# Patient Record
Sex: Female | Born: 1935 | Race: White | Hispanic: No | Smoking: Former smoker
Health system: Southern US, Community
[De-identification: ages and names within clinical notes are randomized; demographics above are authoritative.]

## PROBLEM LIST (undated history)

## (undated) DIAGNOSIS — E785 Hyperlipidemia, unspecified: Secondary | ICD-10-CM

## (undated) DIAGNOSIS — E039 Hypothyroidism, unspecified: Secondary | ICD-10-CM

## (undated) DIAGNOSIS — I10 Essential (primary) hypertension: Secondary | ICD-10-CM

## (undated) DIAGNOSIS — E079 Disorder of thyroid, unspecified: Secondary | ICD-10-CM

## (undated) DIAGNOSIS — I4891 Unspecified atrial fibrillation: Secondary | ICD-10-CM

## (undated) HISTORY — DX: Unspecified atrial fibrillation: I48.91

## (undated) HISTORY — DX: Hypothyroidism, unspecified: E03.9

## (undated) HISTORY — PX: ABDOMINAL HYSTERECTOMY: SHX81

## (undated) HISTORY — DX: Hyperlipidemia, unspecified: E78.5

---

## 2000-04-14 ENCOUNTER — Encounter: Payer: Self-pay | Admitting: Internal Medicine

## 2000-04-14 ENCOUNTER — Encounter: Admission: RE | Admit: 2000-04-14 | Discharge: 2000-04-14 | Payer: Self-pay | Admitting: Internal Medicine

## 2001-01-06 ENCOUNTER — Ambulatory Visit (HOSPITAL_COMMUNITY): Admission: RE | Admit: 2001-01-06 | Discharge: 2001-01-06 | Payer: Self-pay | Admitting: Gastroenterology

## 2001-02-20 ENCOUNTER — Encounter: Admission: RE | Admit: 2001-02-20 | Discharge: 2001-02-20 | Payer: Self-pay | Admitting: Gastroenterology

## 2001-02-20 ENCOUNTER — Encounter: Payer: Self-pay | Admitting: Gastroenterology

## 2001-04-16 ENCOUNTER — Encounter: Admission: RE | Admit: 2001-04-16 | Discharge: 2001-04-16 | Payer: Self-pay | Admitting: Internal Medicine

## 2001-04-16 ENCOUNTER — Encounter: Payer: Self-pay | Admitting: Internal Medicine

## 2001-08-31 ENCOUNTER — Other Ambulatory Visit: Admission: RE | Admit: 2001-08-31 | Discharge: 2001-08-31 | Payer: Self-pay | Admitting: Internal Medicine

## 2003-01-03 ENCOUNTER — Encounter: Payer: Self-pay | Admitting: Internal Medicine

## 2003-01-03 ENCOUNTER — Encounter: Admission: RE | Admit: 2003-01-03 | Discharge: 2003-01-03 | Payer: Self-pay | Admitting: Internal Medicine

## 2003-03-10 ENCOUNTER — Encounter: Admission: RE | Admit: 2003-03-10 | Discharge: 2003-03-10 | Payer: Self-pay | Admitting: Internal Medicine

## 2003-03-10 ENCOUNTER — Encounter: Payer: Self-pay | Admitting: Internal Medicine

## 2003-05-20 ENCOUNTER — Ambulatory Visit (HOSPITAL_BASED_OUTPATIENT_CLINIC_OR_DEPARTMENT_OTHER): Admission: RE | Admit: 2003-05-20 | Discharge: 2003-05-21 | Payer: Self-pay | Admitting: Orthopedic Surgery

## 2003-10-20 ENCOUNTER — Encounter: Admission: RE | Admit: 2003-10-20 | Discharge: 2003-10-20 | Payer: Self-pay | Admitting: Internal Medicine

## 2004-10-31 ENCOUNTER — Encounter: Admission: RE | Admit: 2004-10-31 | Discharge: 2004-10-31 | Payer: Self-pay | Admitting: Internal Medicine

## 2005-11-27 ENCOUNTER — Encounter: Admission: RE | Admit: 2005-11-27 | Discharge: 2005-11-27 | Payer: Self-pay | Admitting: Internal Medicine

## 2006-12-01 ENCOUNTER — Encounter: Admission: RE | Admit: 2006-12-01 | Discharge: 2006-12-01 | Payer: Self-pay | Admitting: Internal Medicine

## 2007-12-02 ENCOUNTER — Encounter: Admission: RE | Admit: 2007-12-02 | Discharge: 2007-12-02 | Payer: Self-pay | Admitting: Internal Medicine

## 2009-01-02 ENCOUNTER — Encounter: Admission: RE | Admit: 2009-01-02 | Discharge: 2009-01-02 | Payer: Self-pay | Admitting: Internal Medicine

## 2011-01-18 NOTE — Op Note (Signed)
   NAME:  Maria [name], Maria [name]                          ACCOUNT NO.:  [id number]   MEDICAL RECORD NO.:  [id number]                   PATIENT TYPE:  AMB   LOCATION:  [facility]                                  FACILITY:  [facility]   PHYSICIAN:  Thera Flake., M.D.             DATE OF BIRTH:  [birth year]   DATE OF PROCEDURE:  05/20/2003  DATE OF DISCHARGE:  05/21/2003                                 OPERATIVE REPORT   INDICATIONS FOR PROCEDURE:  A 77 year old with an MRI proven rotator cuff  tear, thought to be amenable to overnight hospitalization  and surgery.   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   OPERATION:  1. Arthroscopy.  2. Open rotator cuff repair and acromioplasty.  3. Open distal clavicle excision.   SURGEON:  Dyke Brackett, M.D.   DESCRIPTION OF PROCEDURE:  The patient was arthroscoped. Arthroscopic  evaluation showed moderate fraying of  the labrum which was debrided.  Additionally there was a full thickness cuff tear. Debridement of the joint  was carried out separate from the cuff.   It was next converted to an open procedure with an excision bisecting the  acromioclavicular joint and acromion. Dissection of the distal clavicle from  about 1 cm of the excision followed  by an acromioplasty revealing a  moderate sized cuff tear that required 3 Arthrex bio corkscrew suture  anchors, each with two #2 fiberwires. This created essentially a water tight  repair.   Closure was effected with #1 Tycron and 2-0 Vicryl and Monocryl. The patient  was taken to the recovery room in stable condition.                                                Thera Flake., M.D.    WDC/MEDQ  D:  06/21/2003  T:  06/21/2003  Job:  [id number]

## 2011-02-01 ENCOUNTER — Other Ambulatory Visit: Payer: Self-pay | Admitting: Internal Medicine

## 2011-02-08 ENCOUNTER — Ambulatory Visit
Admission: RE | Admit: 2011-02-08 | Discharge: 2011-02-08 | Disposition: A | Discharge status: Home / Self Care | Payer: Medicare Other | Source: Ambulatory Visit | Attending: Internal Medicine | Admitting: Internal Medicine

## 2016-04-11 ENCOUNTER — Emergency Department (HOSPITAL_COMMUNITY): Payer: Medicare Other

## 2016-04-11 ENCOUNTER — Encounter (HOSPITAL_COMMUNITY): Payer: Self-pay | Admitting: Neurology

## 2016-04-11 ENCOUNTER — Emergency Department (HOSPITAL_COMMUNITY)
Admission: EM | Admit: 2016-04-11 | Discharge: 2016-04-11 | Disposition: A | Discharge status: Home / Self Care | Payer: Medicare Other | Attending: Emergency Medicine | Admitting: Emergency Medicine

## 2016-04-11 DIAGNOSIS — M25561 Pain in right knee: Secondary | ICD-10-CM

## 2016-04-11 DIAGNOSIS — S83412A Sprain of medial collateral ligament of left knee, initial encounter: Secondary | ICD-10-CM | POA: Insufficient documentation

## 2016-04-11 DIAGNOSIS — I10 Essential (primary) hypertension: Secondary | ICD-10-CM | POA: Insufficient documentation

## 2016-04-11 DIAGNOSIS — Y929 Unspecified place or not applicable: Secondary | ICD-10-CM | POA: Insufficient documentation

## 2016-04-11 DIAGNOSIS — X509XXA Other and unspecified overexertion or strenuous movements or postures, initial encounter: Secondary | ICD-10-CM | POA: Insufficient documentation

## 2016-04-11 DIAGNOSIS — S83411A Sprain of medial collateral ligament of right knee, initial encounter: Secondary | ICD-10-CM

## 2016-04-11 DIAGNOSIS — Y999 Unspecified external cause status: Secondary | ICD-10-CM | POA: Insufficient documentation

## 2016-04-11 DIAGNOSIS — S8991XA Unspecified injury of right lower leg, initial encounter: Secondary | ICD-10-CM | POA: Diagnosis present

## 2016-04-11 DIAGNOSIS — Y93H1 Activity, digging, shoveling and raking: Secondary | ICD-10-CM | POA: Diagnosis not present

## 2016-04-11 HISTORY — DX: Essential (primary) hypertension: I10

## 2016-04-11 HISTORY — DX: Disorder of thyroid, unspecified: E07.9

## 2016-04-11 MED ORDER — IBUPROFEN 600 MG PO TABS: 600.0000 mg | ORAL_TABLET | Freq: Four times a day (QID) | ORAL | 0 refills | Status: DC

## 2016-04-11 NOTE — ED Provider Notes (Signed)
[facility]PT Provider Note   CSN: [id number] Arrival date & time: 04/11/16  [id number]  First Provider Contact:  First MD Initiated Contact with Patient 04/11/16 [phone]        History   Chief Complaint Chief Complaint  Patient presents with  . Knee Pain    HPI Maria [name] is a 82 y.o. female.  The history is provided by the patient.  Knee Pain   This is a new problem. The current episode started 2 days ago (was pulling yucca out of the ground, afterward noticed pain). The problem occurs constantly. The problem has not changed since onset.The pain is present in the right knee. The quality of the pain is described as aching and sharp. The pain is moderate. Pertinent negatives include no stiffness. She has tried heat ("knee supports" and lidocaine patch) for the symptoms. The treatment provided no (improved slightly with ice) relief. There has been no history of extremity trauma.    Past Medical History:  Diagnosis Date  . Hypertension   . Thyroid disease     There are no active problems to display for this patient.   Past Surgical History:  Procedure Laterality Date  . ABDOMINAL HYSTERECTOMY      OB History    No data available       Home Medications    Prior to Admission medications   Not on File    Family History No family history on file.  Social History Social History  Substance Use Topics  . Smoking status: Not on file  . Smokeless tobacco: Not on file  . Alcohol use Not on file     Allergies   Review of patient's allergies indicates no known allergies.   Review of Systems Review of Systems  Musculoskeletal: Negative for stiffness.  All other systems reviewed and are negative.    Physical Exam Updated Vital Signs BP 143/68 (BP Location: Right Arm)   Pulse 64   Temp 98.3 F (36.8 C) (Oral)   Resp 16   SpO2 98%   Physical Exam  Constitutional: She is oriented to person, place, and time. She appears well-developed and  well-nourished. No distress.  HENT:  Head: Normocephalic.  Eyes: Conjunctivae are normal.  Neck: Neck supple. No tracheal deviation present.  Cardiovascular: Normal rate and regular rhythm.   Pulmonary/Chest: Effort normal. No respiratory distress.  Abdominal: Soft. She exhibits no distension.  Musculoskeletal:       Right knee: She exhibits normal range of motion, no swelling, no effusion, no deformity, no erythema, normal alignment, no LCL laxity, normal patellar mobility, no bony tenderness, normal meniscus and no MCL laxity. Tenderness found. MCL tenderness noted.  Neurological: She is alert and oriented to person, place, and time.  Skin: Skin is warm and dry.  Psychiatric: She has a normal mood and affect.     ED Treatments / Results  Labs (all labs ordered are listed, but only abnormal results are displayed) Labs Reviewed - No data to display  EKG  EKG Interpretation None       Radiology Dg Knee Complete 4 Views Right  Result Date: 04/11/2016 CLINICAL DATA:  Pain and swelling secondary to a twisting injury 2 days ago. EXAM: RIGHT KNEE - COMPLETE 4+ VIEW COMPARISON:  None. FINDINGS: No evidence of fracture, dislocation, or joint effusion. Medial joint space narrowing with small marginal osteophytes in the medial and patellofemoral compartments. Diffuse osteopenia. IMPRESSION: No acute abnormality.  Slight arthritic changes. Electronically Signed   By:  Francene Boyers M.D.   On: 04/11/2016 08:34    Procedures Procedures (including critical care time)  Medications Ordered in ED Medications - No data to display   Initial Impression / Assessment and Plan / ED Course  I have reviewed the triage vital signs and the nursing notes.  Pertinent labs & imaging results that were available during my care of the patient were reviewed by me and considered in my medical decision making (see chart for details).  Clinical Course    82 year old female presents with right knee pain  after repetitive motion 2 days ago shoveling while trying to harvest yucca. She states that she always uses the same position to shovel and pushes off of her right knee. She thought she initially had strain a muscle but it continued to be painful over the course of the last 2 days and caused her great discomfort overnight she comes for evaluation. Plain film is negative for bony injury and clinical appearance is that of a ligamentous sprain due to repetitive motion rather than arthritis but this was considered. Patient is otherwise healthy without chronic kidney disease. Pt given instructions for supportive care including NSAIDs, rest, ice, compression, and elevation to help alleviate symptoms. Plan to follow up with PCP as needed and return precautions discussed for worsening or new concerning symptoms.   Final Clinical Impressions(s) / ED Diagnoses   Final diagnoses:  Knee pain, right  Knee MCL sprain, right, initial encounter    New Prescriptions New Prescriptions   IBUPROFEN (ADVIL,MOTRIN) 600 MG TABLET    Take 1 tablet (600 mg total) by mouth every 6 (six) hours.     Lyndal Pulley, MD 04/11/16 [phone]

## 2016-04-11 NOTE — ED Triage Notes (Signed)
Pt here c/o right knee pain x 3 days. Denies injury or fall. Can walk and bear wt.

## 2016-04-11 NOTE — ED Notes (Signed)
Patient transported to X-ray 

## 2019-10-31 ENCOUNTER — Ambulatory Visit: Payer: Medicare Other | Attending: Internal Medicine

## 2019-10-31 DIAGNOSIS — Z23 Encounter for immunization: Secondary | ICD-10-CM | POA: Insufficient documentation

## 2019-10-31 NOTE — Progress Notes (Signed)
   Covid-19 Vaccination Clinic  Name:  QUINNLYN HEARNS    MRN: [id number] DOB: 12/08/1933  10/31/2019  Ms. Baillargeon was observed post Covid-19 immunization for 15 minutes without incidence. She was provided with Vaccine Information Sheet and instruction to access the V-Safe system.   Ms. Chapa was instructed to call 911 with any severe reactions post vaccine: Marland Kitchen Difficulty breathing  . Swelling of your face and throat  . A fast heartbeat  . A bad rash all over your body  . Dizziness and weakness    Immunizations Administered    Name Date Dose VIS Date Route   Pfizer COVID-19 Vaccine 10/31/2019  1:39 PM 0.3 mL 08/13/2019 Intramuscular   Manufacturer: [employer], [employer]   Lot: [id number]   NDC: [address]-2

## 2019-11-23 ENCOUNTER — Ambulatory Visit: Payer: Medicare Other | Attending: Internal Medicine

## 2019-11-23 DIAGNOSIS — Z23 Encounter for immunization: Secondary | ICD-10-CM

## 2019-11-23 NOTE — Progress Notes (Signed)
   Covid-19 Vaccination Clinic  Name:  Maria [name]    MRN: [id number] DOB: [birth year]  11/23/2019  Maria [name] was observed post Covid-19 immunization for 15 minutes without incident. She was provided with Vaccine Information Sheet and instruction to access the V-Safe system.   Maria [name] was instructed to call 911 with any severe reactions post vaccine: Marland Kitchen Difficulty breathing  . Swelling of face and throat  . A fast heartbeat  . A bad rash all over body  . Dizziness and weakness   Immunizations Administered    Name Date Dose VIS Date Route   Pfizer COVID-19 Vaccine 11/23/2019  8:55 AM 0.3 mL 08/13/2019 Intramuscular   Manufacturer: [employer], [employer]   Lot: [id number]   NDC: [address]-2

## 2019-11-30 ENCOUNTER — Ambulatory Visit: Payer: Medicare Other

## 2020-02-16 ENCOUNTER — Other Ambulatory Visit: Payer: Self-pay | Admitting: Internal Medicine

## 2020-02-16 DIAGNOSIS — M85859 Other specified disorders of bone density and structure, unspecified thigh: Secondary | ICD-10-CM

## 2020-04-18 DIAGNOSIS — I10 Essential (primary) hypertension: Secondary | ICD-10-CM | POA: Diagnosis not present

## 2020-04-18 DIAGNOSIS — E039 Hypothyroidism, unspecified: Secondary | ICD-10-CM | POA: Diagnosis not present

## 2020-04-18 DIAGNOSIS — E785 Hyperlipidemia, unspecified: Secondary | ICD-10-CM | POA: Diagnosis not present

## 2020-05-05 ENCOUNTER — Other Ambulatory Visit: Payer: Medicare Other

## 2020-05-22 DIAGNOSIS — E039 Hypothyroidism, unspecified: Secondary | ICD-10-CM | POA: Diagnosis not present

## 2020-05-22 DIAGNOSIS — E785 Hyperlipidemia, unspecified: Secondary | ICD-10-CM | POA: Diagnosis not present

## 2020-05-22 DIAGNOSIS — I10 Essential (primary) hypertension: Secondary | ICD-10-CM | POA: Diagnosis not present

## 2020-08-29 ENCOUNTER — Other Ambulatory Visit: Payer: Self-pay | Admitting: Internal Medicine

## 2020-08-29 DIAGNOSIS — M85859 Other specified disorders of bone density and structure, unspecified thigh: Secondary | ICD-10-CM

## 2020-12-06 DIAGNOSIS — K219 Gastro-esophageal reflux disease without esophagitis: Secondary | ICD-10-CM | POA: Diagnosis not present

## 2020-12-06 DIAGNOSIS — E039 Hypothyroidism, unspecified: Secondary | ICD-10-CM | POA: Diagnosis not present

## 2020-12-06 DIAGNOSIS — E785 Hyperlipidemia, unspecified: Secondary | ICD-10-CM | POA: Diagnosis not present

## 2020-12-06 DIAGNOSIS — I10 Essential (primary) hypertension: Secondary | ICD-10-CM | POA: Diagnosis not present

## 2020-12-06 DIAGNOSIS — G8929 Other chronic pain: Secondary | ICD-10-CM | POA: Diagnosis not present

## 2021-01-05 DIAGNOSIS — E785 Hyperlipidemia, unspecified: Secondary | ICD-10-CM | POA: Diagnosis not present

## 2021-01-05 DIAGNOSIS — I10 Essential (primary) hypertension: Secondary | ICD-10-CM | POA: Diagnosis not present

## 2021-01-05 DIAGNOSIS — G8929 Other chronic pain: Secondary | ICD-10-CM | POA: Diagnosis not present

## 2021-01-05 DIAGNOSIS — E039 Hypothyroidism, unspecified: Secondary | ICD-10-CM | POA: Diagnosis not present

## 2021-01-05 DIAGNOSIS — K219 Gastro-esophageal reflux disease without esophagitis: Secondary | ICD-10-CM | POA: Diagnosis not present

## 2021-02-07 DIAGNOSIS — E785 Hyperlipidemia, unspecified: Secondary | ICD-10-CM | POA: Diagnosis not present

## 2021-02-07 DIAGNOSIS — I10 Essential (primary) hypertension: Secondary | ICD-10-CM | POA: Diagnosis not present

## 2021-02-07 DIAGNOSIS — G8929 Other chronic pain: Secondary | ICD-10-CM | POA: Diagnosis not present

## 2021-02-07 DIAGNOSIS — K219 Gastro-esophageal reflux disease without esophagitis: Secondary | ICD-10-CM | POA: Diagnosis not present

## 2021-02-07 DIAGNOSIS — E039 Hypothyroidism, unspecified: Secondary | ICD-10-CM | POA: Diagnosis not present

## 2021-02-16 DIAGNOSIS — E785 Hyperlipidemia, unspecified: Secondary | ICD-10-CM | POA: Diagnosis not present

## 2021-02-16 DIAGNOSIS — E039 Hypothyroidism, unspecified: Secondary | ICD-10-CM | POA: Diagnosis not present

## 2021-02-16 DIAGNOSIS — I10 Essential (primary) hypertension: Secondary | ICD-10-CM | POA: Diagnosis not present

## 2021-03-13 ENCOUNTER — Ambulatory Visit
Admission: RE | Admit: 2021-03-13 | Discharge: 2021-03-13 | Disposition: A | Discharge status: Home / Self Care | Payer: Medicare HMO | Source: Ambulatory Visit | Attending: Internal Medicine | Admitting: Internal Medicine

## 2021-03-13 ENCOUNTER — Other Ambulatory Visit: Payer: Self-pay

## 2021-03-13 DIAGNOSIS — Z780 Asymptomatic menopausal state: Secondary | ICD-10-CM | POA: Diagnosis not present

## 2021-03-13 DIAGNOSIS — M810 Age-related osteoporosis without current pathological fracture: Secondary | ICD-10-CM | POA: Diagnosis not present

## 2021-03-13 DIAGNOSIS — M85831 Other specified disorders of bone density and structure, right forearm: Secondary | ICD-10-CM | POA: Diagnosis not present

## 2021-03-13 DIAGNOSIS — M85859 Other specified disorders of bone density and structure, unspecified thigh: Secondary | ICD-10-CM

## 2021-03-30 DIAGNOSIS — G8929 Other chronic pain: Secondary | ICD-10-CM | POA: Diagnosis not present

## 2021-03-30 DIAGNOSIS — E039 Hypothyroidism, unspecified: Secondary | ICD-10-CM | POA: Diagnosis not present

## 2021-03-30 DIAGNOSIS — I10 Essential (primary) hypertension: Secondary | ICD-10-CM | POA: Diagnosis not present

## 2021-03-30 DIAGNOSIS — K219 Gastro-esophageal reflux disease without esophagitis: Secondary | ICD-10-CM | POA: Diagnosis not present

## 2021-03-30 DIAGNOSIS — E785 Hyperlipidemia, unspecified: Secondary | ICD-10-CM | POA: Diagnosis not present

## 2021-04-05 DIAGNOSIS — E785 Hyperlipidemia, unspecified: Secondary | ICD-10-CM | POA: Diagnosis not present

## 2021-04-05 DIAGNOSIS — K219 Gastro-esophageal reflux disease without esophagitis: Secondary | ICD-10-CM | POA: Diagnosis not present

## 2021-04-05 DIAGNOSIS — E039 Hypothyroidism, unspecified: Secondary | ICD-10-CM | POA: Diagnosis not present

## 2021-04-05 DIAGNOSIS — G8929 Other chronic pain: Secondary | ICD-10-CM | POA: Diagnosis not present

## 2021-04-05 DIAGNOSIS — I10 Essential (primary) hypertension: Secondary | ICD-10-CM | POA: Diagnosis not present

## 2021-06-01 DIAGNOSIS — K219 Gastro-esophageal reflux disease without esophagitis: Secondary | ICD-10-CM | POA: Diagnosis not present

## 2021-06-01 DIAGNOSIS — E785 Hyperlipidemia, unspecified: Secondary | ICD-10-CM | POA: Diagnosis not present

## 2021-06-01 DIAGNOSIS — I10 Essential (primary) hypertension: Secondary | ICD-10-CM | POA: Diagnosis not present

## 2021-06-01 DIAGNOSIS — E039 Hypothyroidism, unspecified: Secondary | ICD-10-CM | POA: Diagnosis not present

## 2021-06-01 DIAGNOSIS — G8929 Other chronic pain: Secondary | ICD-10-CM | POA: Diagnosis not present

## 2021-08-01 DIAGNOSIS — G8929 Other chronic pain: Secondary | ICD-10-CM | POA: Diagnosis not present

## 2021-08-01 DIAGNOSIS — I10 Essential (primary) hypertension: Secondary | ICD-10-CM | POA: Diagnosis not present

## 2021-09-27 DIAGNOSIS — E785 Hyperlipidemia, unspecified: Secondary | ICD-10-CM | POA: Diagnosis not present

## 2021-09-27 DIAGNOSIS — G8929 Other chronic pain: Secondary | ICD-10-CM | POA: Diagnosis not present

## 2021-09-27 DIAGNOSIS — I10 Essential (primary) hypertension: Secondary | ICD-10-CM | POA: Diagnosis not present

## 2021-09-27 DIAGNOSIS — E039 Hypothyroidism, unspecified: Secondary | ICD-10-CM | POA: Diagnosis not present

## 2021-10-18 ENCOUNTER — Inpatient Hospital Stay (HOSPITAL_COMMUNITY)
Admission: EM | Admit: 2021-10-18 | Discharge: 2021-10-22 | DRG: 565 | Disposition: A | Discharge status: Home / Self Care | Payer: Medicare Other | Attending: Family Medicine | Admitting: Family Medicine

## 2021-10-18 ENCOUNTER — Other Ambulatory Visit: Payer: Self-pay

## 2021-10-18 ENCOUNTER — Emergency Department (HOSPITAL_COMMUNITY): Payer: Medicare Other

## 2021-10-18 ENCOUNTER — Encounter (HOSPITAL_COMMUNITY): Payer: Self-pay | Admitting: Emergency Medicine

## 2021-10-18 DIAGNOSIS — R7989 Other specified abnormal findings of blood chemistry: Secondary | ICD-10-CM | POA: Diagnosis not present

## 2021-10-18 DIAGNOSIS — L8990 Pressure ulcer of unspecified site, unspecified stage: Secondary | ICD-10-CM | POA: Diagnosis present

## 2021-10-18 DIAGNOSIS — E039 Hypothyroidism, unspecified: Secondary | ICD-10-CM

## 2021-10-18 DIAGNOSIS — W19XXXA Unspecified fall, initial encounter: Secondary | ICD-10-CM | POA: Diagnosis present

## 2021-10-18 DIAGNOSIS — R000 Tachycardia, unspecified: Secondary | ICD-10-CM | POA: Diagnosis not present

## 2021-10-18 DIAGNOSIS — E860 Dehydration: Secondary | ICD-10-CM

## 2021-10-18 DIAGNOSIS — R350 Frequency of micturition: Secondary | ICD-10-CM | POA: Diagnosis not present

## 2021-10-18 DIAGNOSIS — I10 Essential (primary) hypertension: Secondary | ICD-10-CM

## 2021-10-18 DIAGNOSIS — K8020 Calculus of gallbladder without cholecystitis without obstruction: Secondary | ICD-10-CM | POA: Diagnosis not present

## 2021-10-18 DIAGNOSIS — I4891 Unspecified atrial fibrillation: Secondary | ICD-10-CM

## 2021-10-18 DIAGNOSIS — M6282 Rhabdomyolysis: Secondary | ICD-10-CM

## 2021-10-18 DIAGNOSIS — E872 Acidosis, unspecified: Secondary | ICD-10-CM

## 2021-10-18 DIAGNOSIS — R296 Repeated falls: Secondary | ICD-10-CM

## 2021-10-18 DIAGNOSIS — L89152 Pressure ulcer of sacral region, stage 2: Secondary | ICD-10-CM | POA: Diagnosis not present

## 2021-10-18 DIAGNOSIS — R52 Pain, unspecified: Secondary | ICD-10-CM

## 2021-10-18 DIAGNOSIS — T796XXA Traumatic ischemia of muscle, initial encounter: Secondary | ICD-10-CM | POA: Diagnosis not present

## 2021-10-18 DIAGNOSIS — R778 Other specified abnormalities of plasma proteins: Secondary | ICD-10-CM

## 2021-10-18 DIAGNOSIS — R6510 Systemic inflammatory response syndrome (SIRS) of non-infectious origin without acute organ dysfunction: Secondary | ICD-10-CM

## 2021-10-18 DIAGNOSIS — E876 Hypokalemia: Secondary | ICD-10-CM | POA: Diagnosis not present

## 2021-10-18 DIAGNOSIS — M25511 Pain in right shoulder: Secondary | ICD-10-CM | POA: Diagnosis not present

## 2021-10-18 DIAGNOSIS — Z20822 Contact with and (suspected) exposure to covid-19: Secondary | ICD-10-CM | POA: Diagnosis not present

## 2021-10-18 DIAGNOSIS — Z743 Need for continuous supervision: Secondary | ICD-10-CM | POA: Diagnosis not present

## 2021-10-18 DIAGNOSIS — Z79899 Other long term (current) drug therapy: Secondary | ICD-10-CM

## 2021-10-18 DIAGNOSIS — S4991XA Unspecified injury of right shoulder and upper arm, initial encounter: Secondary | ICD-10-CM | POA: Diagnosis not present

## 2021-10-18 DIAGNOSIS — I480 Paroxysmal atrial fibrillation: Secondary | ICD-10-CM | POA: Insufficient documentation

## 2021-10-18 DIAGNOSIS — F411 Generalized anxiety disorder: Secondary | ICD-10-CM | POA: Insufficient documentation

## 2021-10-18 DIAGNOSIS — F419 Anxiety disorder, unspecified: Secondary | ICD-10-CM | POA: Diagnosis present

## 2021-10-18 DIAGNOSIS — I499 Cardiac arrhythmia, unspecified: Secondary | ICD-10-CM | POA: Diagnosis not present

## 2021-10-18 DIAGNOSIS — M19011 Primary osteoarthritis, right shoulder: Secondary | ICD-10-CM | POA: Diagnosis not present

## 2021-10-18 LAB — URINALYSIS, ROUTINE W REFLEX MICROSCOPIC
Bacteria, UA: NONE SEEN
Bilirubin Urine: NEGATIVE
Glucose, UA: NEGATIVE mg/dL
Ketones, ur: 80 mg/dL — AB
Leukocytes,Ua: NEGATIVE
Nitrite: NEGATIVE
Protein, ur: >=300 mg/dL — AB
Specific Gravity, Urine: 1.02 (ref 1.005–1.030)
pH: 6 (ref 5.0–8.0)

## 2021-10-18 LAB — TROPONIN I (HIGH SENSITIVITY)
Troponin I (High Sensitivity): 49 ng/L — ABNORMAL HIGH (ref <18)
Troponin I (High Sensitivity): 52 ng/L — ABNORMAL HIGH (ref <18)

## 2021-10-18 LAB — CBC WITH DIFFERENTIAL/PLATELET
Abs Immature Granulocytes: 0.16 10*3/uL — ABNORMAL HIGH (ref 0.00–0.07)
Basophils Absolute: 0 10*3/uL (ref 0.0–0.1)
Basophils Relative: 0 %
Eosinophils Absolute: 0 10*3/uL (ref 0.0–0.5)
Eosinophils Relative: 0 %
HCT: 44.9 % (ref 36.0–46.0)
Hemoglobin: 14.4 g/dL (ref 12.0–15.0)
Immature Granulocytes: 1 %
Lymphocytes Relative: 4 %
Lymphs Abs: 1 10*3/uL (ref 0.7–4.0)
MCH: 28.2 pg (ref 26.0–34.0)
MCHC: 32.1 g/dL (ref 30.0–36.0)
MCV: 87.9 fL (ref 80.0–100.0)
Monocytes Absolute: 2.7 10*3/uL — ABNORMAL HIGH (ref 0.1–1.0)
Monocytes Relative: 11 %
Neutro Abs: 21.6 10*3/uL — ABNORMAL HIGH (ref 1.7–7.7)
Neutrophils Relative %: 84 %
Platelets: 240 10*3/uL (ref 150–400)
RBC: 5.11 MIL/uL (ref 3.87–5.11)
RDW: 16.1 % — ABNORMAL HIGH (ref 11.5–15.5)
WBC: 25.5 10*3/uL — ABNORMAL HIGH (ref 4.0–10.5)
nRBC: 0 % (ref 0.0–0.2)

## 2021-10-18 LAB — RESP PANEL BY RT-PCR (FLU A&B, COVID) ARPGX2
Influenza A by PCR: NEGATIVE
Influenza B by PCR: NEGATIVE
SARS Coronavirus 2 by RT PCR: NEGATIVE

## 2021-10-18 LAB — COMPREHENSIVE METABOLIC PANEL
ALT: 143 U/L — ABNORMAL HIGH (ref 0–44)
AST: 393 U/L — ABNORMAL HIGH (ref 15–41)
Albumin: 4 g/dL (ref 3.5–5.0)
Alkaline Phosphatase: 55 U/L (ref 38–126)
Anion gap: 13 (ref 5–15)
BUN: 29 mg/dL — ABNORMAL HIGH (ref 8–23)
CO2: 23 mmol/L (ref 22–32)
Calcium: 8.8 mg/dL — ABNORMAL LOW (ref 8.9–10.3)
Chloride: 108 mmol/L (ref 98–111)
Creatinine, Ser: 0.51 mg/dL (ref 0.44–1.00)
GFR, Estimated: >60 mL/min (ref >60)
Glucose, Bld: 114 mg/dL — ABNORMAL HIGH (ref 70–99)
Potassium: 4.6 mmol/L (ref 3.5–5.1)
Sodium: 144 mmol/L (ref 135–145)
Total Bilirubin: 2.3 mg/dL — ABNORMAL HIGH (ref 0.3–1.2)
Total Protein: 7.6 g/dL (ref 6.5–8.1)

## 2021-10-18 LAB — LACTIC ACID, PLASMA
Lactic Acid, Venous: 2.2 mmol/L (ref 0.5–1.9)
Lactic Acid, Venous: 1.7 mmol/L (ref 0.5–1.9)

## 2021-10-18 LAB — AMMONIA: Ammonia: 37 umol/L — ABNORMAL HIGH (ref 9–35)

## 2021-10-18 LAB — MAGNESIUM: Magnesium: 2.7 mg/dL — ABNORMAL HIGH (ref 1.7–2.4)

## 2021-10-18 LAB — LIPASE, BLOOD: Lipase: 39 U/L (ref 11–51)

## 2021-10-18 LAB — TSH: TSH: 1.228 u[IU]/mL (ref 0.350–4.500)

## 2021-10-18 LAB — CK: Total CK: 9498 U/L — ABNORMAL HIGH (ref 38–234)

## 2021-10-18 LAB — PROTIME-INR
INR: 1.1 (ref 0.8–1.2)
Prothrombin Time: 14.6 seconds (ref 11.4–15.2)

## 2021-10-18 LAB — APTT: aPTT: 27 seconds (ref 24–36)

## 2021-10-18 IMAGING — CT CT HEAD W/O CM
3 series · 14 of 47 positions shown, 16 images · non-contrast
Comparison: None.

None

CLINICAL DATA: A female at age 87 presents for evaluation of neck
trauma, multiple falls over the past 2 days



[Series 2: head wo · axial · 0.47mm/px · z∈[-159,-24]mm · 8 of 33 slices shown, 10 images]
[im 3/33  brain]
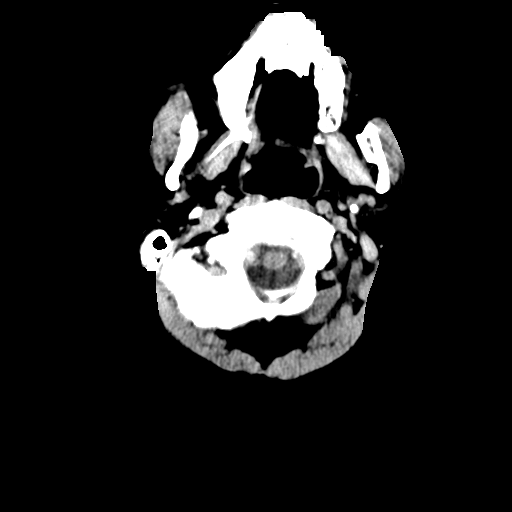
[im 3/33  bone]
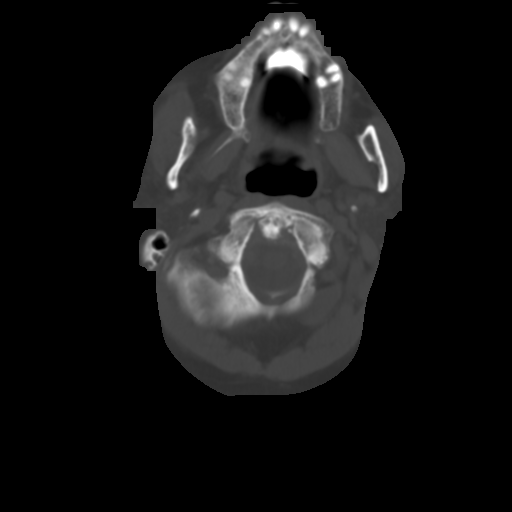
[im 7/33  brain]
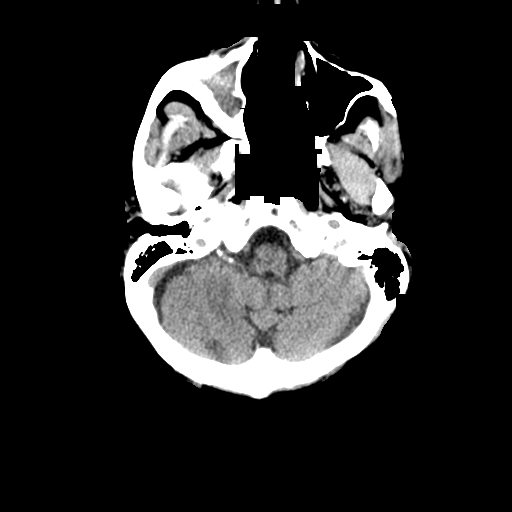
[im 10/33  brain]
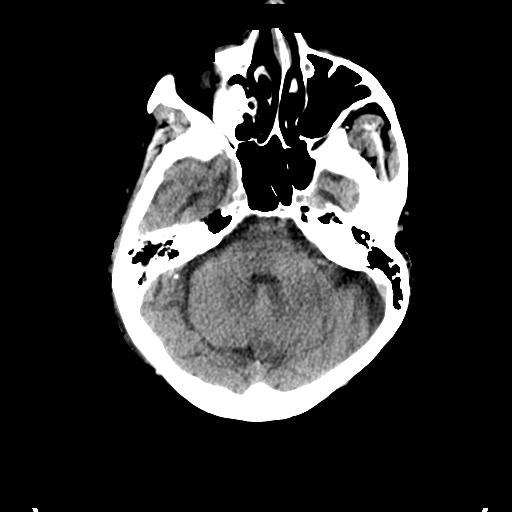
[im 15/33  brain]
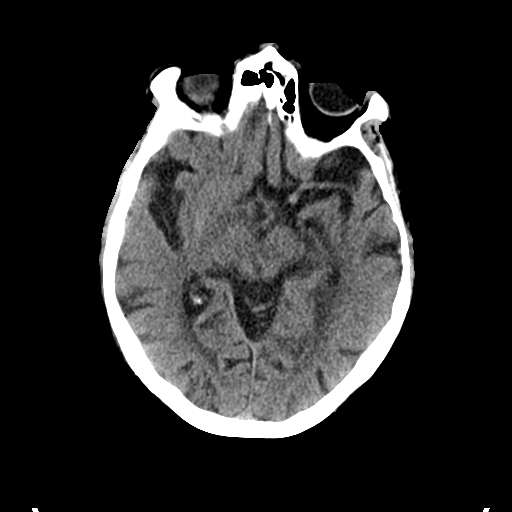
[im 18/33  brain]
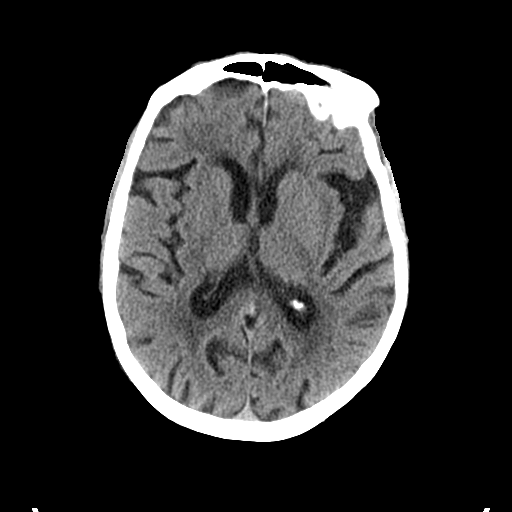
[im 18/33  bone]
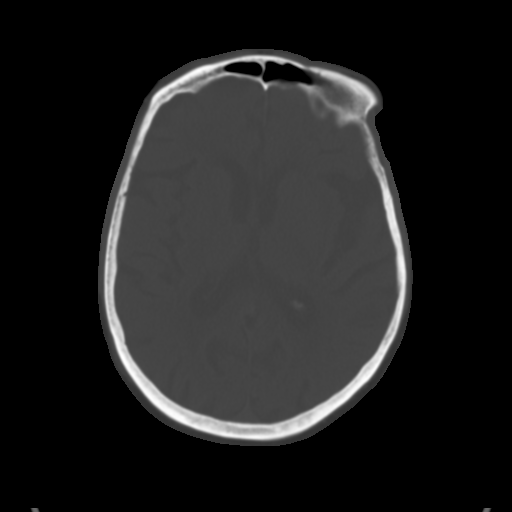
[im 23/33  brain]
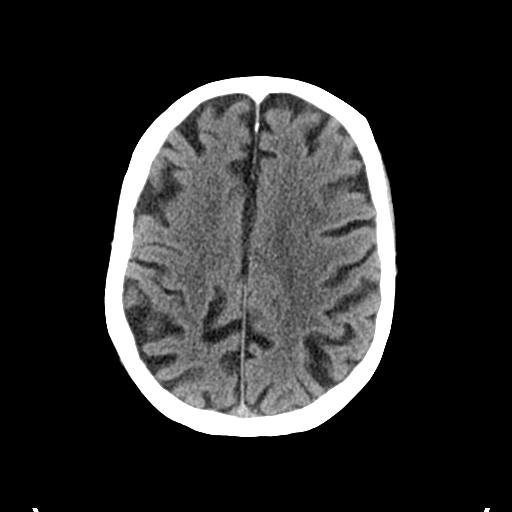
[im 26/33  brain]
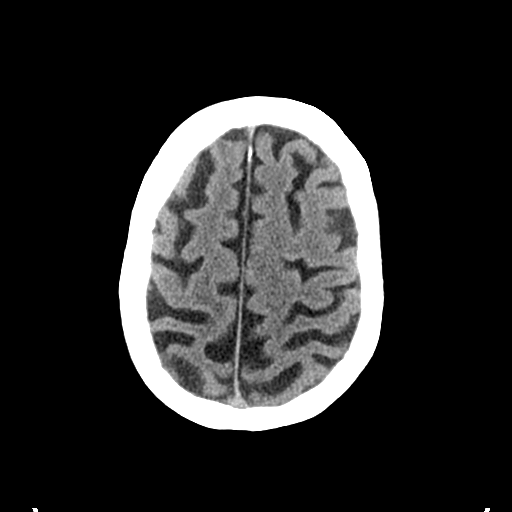
[im 30/33  brain]
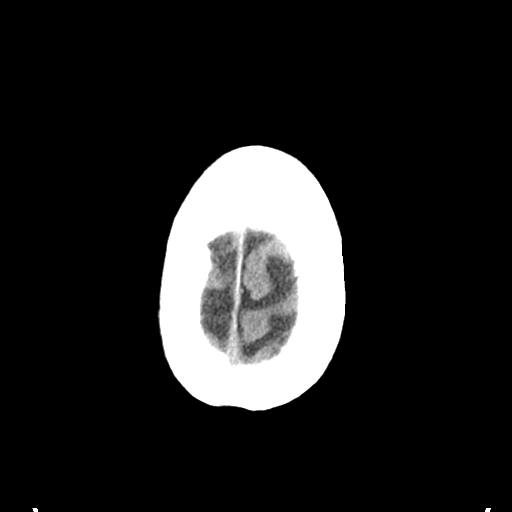

[Series 5: coronal soft tissue · coronal · 0.33mm/px · 3 of 74 slices shown]
[im 25/74  brain]
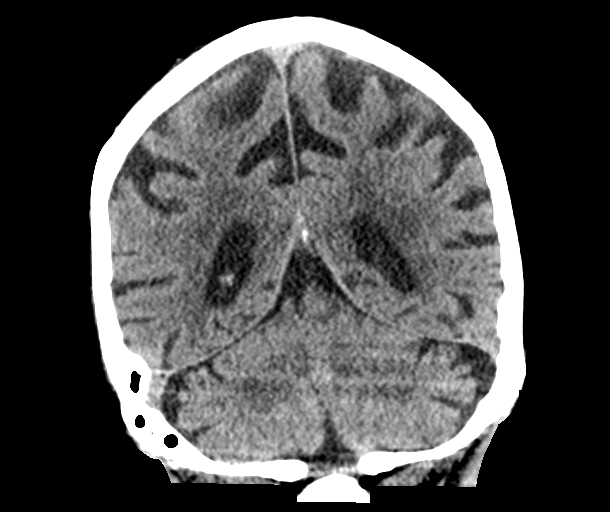
[im 33/74  brain]
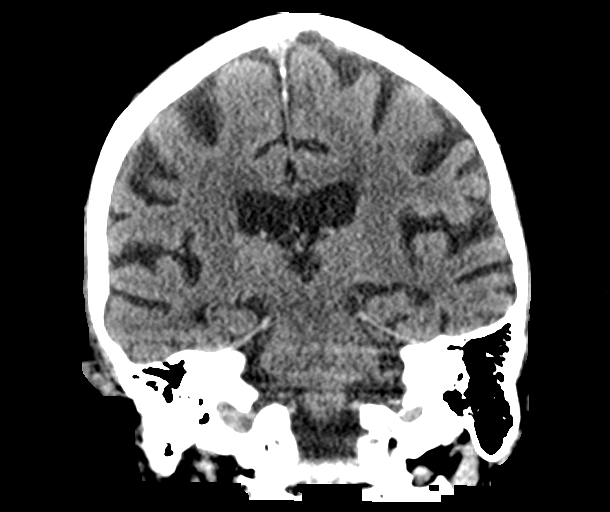
[im 41/74  brain]
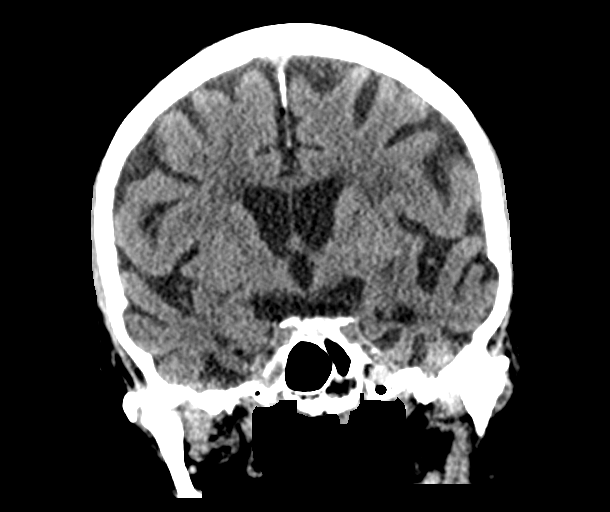

[Series 6: sagittal soft tissue · sagittal · 0.33mm/px · 3 of 59 slices shown]
[im 20/59  brain]
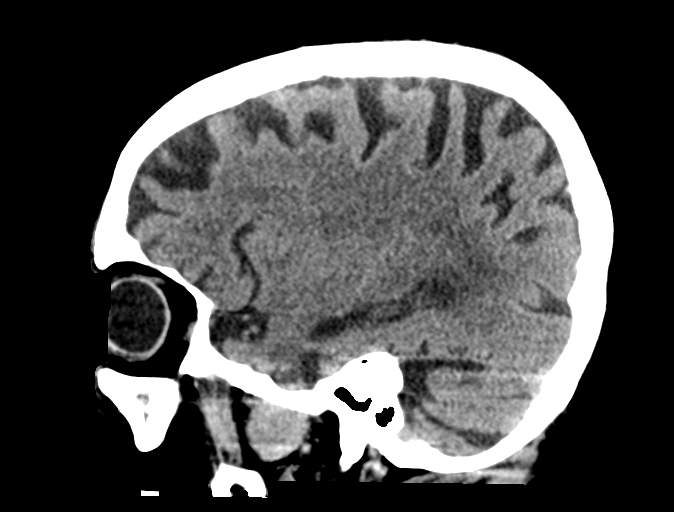
[im 30/59  brain]
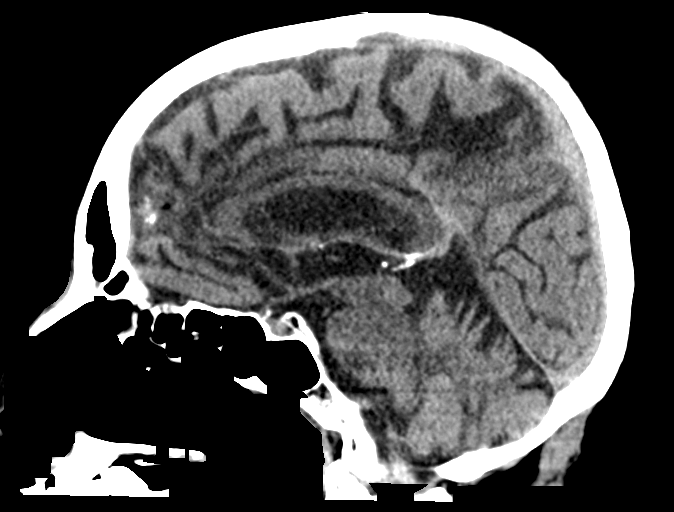
[im 39/59  brain]
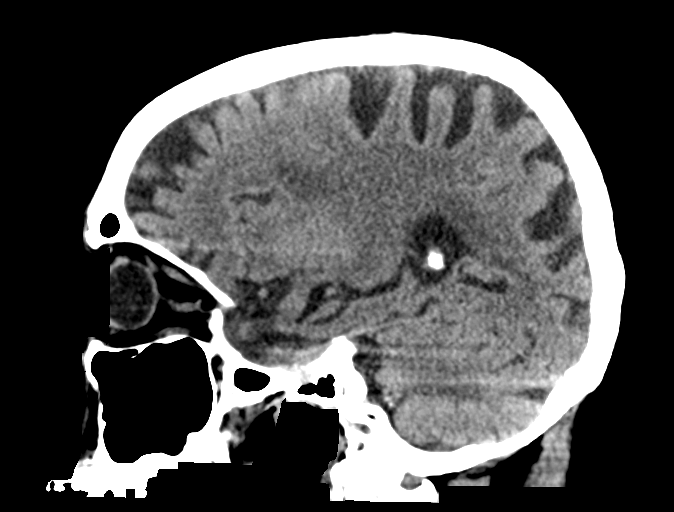

[14 of 47 positions shown; findings below may reference images not displayed]

FINDINGS: CT HEAD FINDINGS

Brain: No evidence of acute infarction, hemorrhage, hydrocephalus,
extra-axial collection or mass lesion/mass effect. Signs of chronic
microvascular ischemic change and generalized atrophy.

Vascular: No hyperdense vessel or unexpected calcification.

Skull: Normal. Negative for fracture or focal lesion.

Sinuses/Orbits: Complete opacification of the RIGHT maxillary sinus
with bony thickening compatible with chronic sinusitis. No air-fluid
levels in the remaining sinuses with minimal scattered ethmoid
opacification.

Other: None

CT CERVICAL SPINE FINDINGS

Alignment: Areas of variable antra retrolisthesis very mild in the
cervical spine associated with marked degenerative changes. 1-2 mm
anterolisthesis of C3 on C4. 3 mm retrolisthesis of C4 on C5. 3 mm
anterolisthesis of C6 on C7. Preservation and mild accentuation of
normal cervical lordotic curvature. 3 mm anterolisthesis of T1 on T2
is noted also with surrounding degenerative changes and
approximately 2 mm anterolisthesis of C2 on T3.

Skull base and vertebrae: No acute fracture. No primary bone lesion
or focal pathologic process.

Soft tissues and spinal canal: No prevertebral fluid or swelling. No
visible canal hematoma.

Disc levels: Multilevel disc space narrowing and facet arthropathy
associated with areas of antro and retrolisthesis as discussed
above. Degenerative changes are marked and worse in the mid and
lower cervical spine.

Upper chest: Pulmonary emphysema, moderate at the lung apices.

Other: None
IMPRESSION: 1. No acute intracranial abnormality.
2. Signs of chronic microvascular ischemic change and generalized
atrophy.
3. No evidence for acute traumatic injury to the cervical spine.
4. Multilevel degenerative changes as discussed with associated
changes and alignment likely related to profound disc space
narrowing and multilevel facet arthropathy. Not associated with
prevertebral soft tissue swelling.
5. Pulmonary emphysema, moderate at the lung apices.
6. Chronic RIGHT maxillary sinusitis complete opacification of the
RIGHT maxillary sinus is noted. Correlate with any symptoms of sinus
disease.

Emphysema ([ST]-[ST]).

## 2021-10-18 MED ORDER — DILTIAZEM HCL-DEXTROSE 125-5 MG/125ML-% IV SOLN (PREMIX): 5.0000 mg/h | INTRAVENOUS | Status: DC

## 2021-10-18 MED ORDER — HEPARIN (PORCINE) 25000 UT/250ML-% IV SOLN
1150.0000 [IU]/h | INTRAVENOUS | Status: AC
  Administered 2021-10-18: 21:00:00 800 [IU]/h via INTRAVENOUS
  Administered 2021-10-19: 22:00:00 1050 [IU]/h via INTRAVENOUS
  Filled 2021-10-18 (×2): qty 250

## 2021-10-18 MED ORDER — HEPARIN BOLUS VIA INFUSION
2000.0000 [IU] | Freq: Once | INTRAVENOUS | Status: AC
  Administered 2021-10-18: 2000 [IU] via INTRAVENOUS
  Filled 2021-10-18: qty 2000

## 2021-10-18 MED ORDER — SODIUM CHLORIDE 0.9 % IV SOLN: INTRAVENOUS | Status: DC

## 2021-10-18 MED ORDER — NYSTATIN 100000 UNIT/GM EX POWD
Freq: Once | CUTANEOUS | Status: AC
Start: 2021-10-18 — End: 2021-10-18
  Filled 2021-10-18: qty 15

## 2021-10-18 MED ORDER — DILTIAZEM HCL-DEXTROSE 125-5 MG/125ML-% IV SOLN (PREMIX)
5.0000 mg/h | INTRAVENOUS | Status: DC
  Administered 2021-10-18 – 2021-10-19 (×3): 5 mg/h via INTRAVENOUS
  Filled 2021-10-18 (×3): qty 125

## 2021-10-18 MED ORDER — SODIUM CHLORIDE 0.9 % IV BOLUS
1000.0000 mL | Freq: Once | INTRAVENOUS | Status: AC
  Administered 2021-10-18: 1000 mL via INTRAVENOUS

## 2021-10-18 NOTE — ED Provider Notes (Signed)
**Maria [name] De-Identified via Obfuscation** Maria Maria [name]   CSN: [id number] Arrival date & time: 10/18/21  1230     History  Chief Complaint  Patient presents with   Maria Maria [name] is a 88 y.o. female.  The history is provided by the patient and medical records. No language interpreter was used.  Fall This is a recurrent problem. The current episode started yesterday. The problem occurs rarely. The problem has not changed since onset.Pertinent negatives include no chest pain, no abdominal pain, no headaches and no shortness of breath. Nothing aggravates the symptoms. Nothing relieves the symptoms. She has tried nothing for the symptoms. The treatment provided no relief.      Home Medications Prior to Admission medications   Medication Sig Start Date End Date Taking? Authorizing Provider  ibuprofen (ADVIL,MOTRIN) 600 MG tablet Take 1 tablet (600 mg total) by mouth every 6 (six) hours. 04/11/16   Leo Grosser, MD      Allergies    Patient has no known allergies.    Review of Systems   Review of Systems  Constitutional:  Positive for fatigue. Negative for chills, diaphoresis and fever.  HENT:  Negative for congestion.   Respiratory:  Negative for cough, chest tightness, shortness of breath, wheezing and stridor.   Cardiovascular:  Negative for chest pain, palpitations and leg swelling.  Gastrointestinal:  Negative for abdominal pain, constipation, diarrhea, nausea and vomiting.  Genitourinary:  Negative for dysuria and flank pain.  Musculoskeletal:  Negative for back pain, neck pain and neck stiffness.  Skin:  Positive for color change. Negative for rash and wound.  Neurological:  Positive for light-headedness. Negative for dizziness, seizures, speech difficulty, weakness, numbness and headaches.  Psychiatric/Behavioral:  Negative for agitation and confusion.   All other systems reviewed and are negative.  Physical Exam Updated Vital Signs There were no vitals  taken for this visit. Physical Exam Vitals and nursing Maria [name] reviewed.  Constitutional:      General: She is not in acute distress.    Appearance: She is well-developed. She is not ill-appearing, toxic-appearing or diaphoretic.  HENT:     Head: Contusion present.     Comments: Redness and bruising on right scalp, right shoulder, right torso.    Nose: No congestion.     Mouth/Throat:     Mouth: Mucous membranes are dry.  Eyes:     Extraocular Movements: Extraocular movements intact.     Conjunctiva/sclera: Conjunctivae normal.     Pupils: Pupils are equal, round, and reactive to light.  Cardiovascular:     Rate and Rhythm: Tachycardia present. Rhythm irregular.     Heart sounds: No murmur heard. Pulmonary:     Effort: Pulmonary effort is normal. No respiratory distress.     Breath sounds: Normal breath sounds. No wheezing, rhonchi or rales.  Chest:     Chest wall: No tenderness.  Abdominal:     General: Abdomen is flat.     Palpations: Abdomen is soft.     Tenderness: There is no abdominal tenderness. There is no right CVA tenderness, left CVA tenderness, guarding or rebound.  Musculoskeletal:        General: Tenderness present. No swelling.     Right shoulder: Swelling and tenderness present.     Cervical back: Neck supple. No tenderness.     Comments: Intact sensation, strength, and pulses in extremities.  Bruising on right shoulder with some tenderness and swelling.  Some irritation erythema to right flank.  Under her pannus there is evidence of likely yeast infection.  No other tenderness.  Skin:    General: Skin is warm and dry.     Capillary Refill: Capillary refill takes less than 2 seconds.     Findings: Bruising and erythema present.  Neurological:     General: No focal deficit present.     Mental Status: She is alert.     Sensory: No sensory deficit.     Motor: No weakness.  Psychiatric:        Mood and Affect: Mood normal.    ED Results / Procedures /  Treatments   Labs (all labs ordered are listed, but only abnormal results are displayed) Labs Reviewed  CBC WITH DIFFERENTIAL/PLATELET - Abnormal; Notable for the following components:      Result Value   WBC 25.5 (*)    RDW 16.1 (*)    Neutro Abs 21.6 (*)    Monocytes Absolute 2.7 (*)    Abs Immature Granulocytes 0.16 (*)    All other components within normal limits  COMPREHENSIVE METABOLIC PANEL - Abnormal; Notable for the following components:   Glucose, Bld 114 (*)    BUN 29 (*)    Calcium 8.8 (*)    AST 393 (*)    ALT 143 (*)    Total Bilirubin 2.3 (*)    All other components within normal limits  LACTIC ACID, PLASMA - Abnormal; Notable for the following components:   Lactic Acid, Venous 2.2 (*)    All other components within normal limits  CK - Abnormal; Notable for the following components:   Total CK 9,498 (*)    All other components within normal limits  URINALYSIS, ROUTINE W REFLEX MICROSCOPIC - Abnormal; Notable for the following components:   Hgb urine dipstick LARGE (*)    Ketones, ur 80 (*)    Protein, ur >=300 (*)    All other components within normal limits  MAGNESIUM - Abnormal; Notable for the following components:   Magnesium 2.7 (*)    All other components within normal limits  TROPONIN I (HIGH SENSITIVITY) - Abnormal; Notable for the following components:   Troponin I (High Sensitivity) 49 (*)    All other components within normal limits  RESP PANEL BY RT-PCR (FLU A&B, COVID) ARPGX2  URINE CULTURE  LIPASE, BLOOD  TSH  LACTIC ACID, PLASMA  HEPATITIS PANEL, ACUTE  AMMONIA  PROTIME-INR  TROPONIN I (HIGH SENSITIVITY)    EKG None  Radiology DG Shoulder Right  Result Date: 10/18/2021 CLINICAL DATA:  Fall, right shoulder pain and bruising EXAM: RIGHT SHOULDER - 2+ VIEW COMPARISON:  Shoulder radiographs obtained earlier the same day FINDINGS: There is marked degenerative change at the glenohumeral joint with a high-riding humeral head suggesting  rotator cuff pathology. There are moderate degenerative changes of the [facility] joint. The previously described lucent lesion is favored to reflect artifact. There is no acute fracture or dislocation. IMPRESSION: 1. The previously described lucent lesion is favored to reflect artifact. No suspicious lesions are seen. 2. Marked degenerative changes about the shoulder as above. No acute fracture or dislocation. Electronically Signed   By: Valetta Mole M.D.   On: 10/18/2021 14:46   DG Shoulder Right  Result Date: 10/18/2021 CLINICAL DATA:  Found down, tachycardia EXAM: RIGHT SHOULDER - 2+ VIEW COMPARISON:  None FINDINGS: Osseous demineralization. Degenerative changes at [facility] joint with significant inferior acromial spur formation. Glenohumeral joint space narrowing and spur formation. Questionable lucent lesion within the proximal RIGHT  humerus at the head/metaphyseal junction versus artifact from surrounding spurs. No acute fracture or dislocation. Remodeling of the undersurface of the acromion likely reflecting chronic rotator cuff tear. No acute rib abnormalities. IMPRESSION: Degenerative changes of the RIGHT acromioclavicular and glenohumeral joints knee without acute fracture or dislocation. Questionable lucent lesion at the head/metaphyseal junction of the proximal RIGHT humerus versus artifact related to surrounding spur formation; consider internal and external rotation views of RIGHT shoulder when patient is clinically able. Electronically Signed   By: Lavonia Dana M.D.   On: 10/18/2021 13:57   CT HEAD WO CONTRAST (5MM)  Result Date: 10/18/2021 CLINICAL DATA:  A female at age 28 presents for evaluation of neck trauma, multiple falls over the past 2 days EXAM: CT HEAD WITHOUT CONTRAST CT CERVICAL SPINE WITHOUT CONTRAST TECHNIQUE: Multidetector CT imaging of the head and cervical spine was performed following the standard protocol without intravenous contrast. Multiplanar CT image reconstructions of the cervical  spine were also generated. RADIATION DOSE REDUCTION: This exam was performed according to the departmental dose-optimization program which includes automated exposure control, adjustment of the mA and/or kV according to patient size and/or use of iterative reconstruction technique. COMPARISON:  None. None FINDINGS: CT HEAD FINDINGS Brain: No evidence of acute infarction, hemorrhage, hydrocephalus, extra-axial collection or mass lesion/mass effect. Signs of chronic microvascular ischemic change and generalized atrophy. Vascular: No hyperdense vessel or unexpected calcification. Skull: Normal. Negative for fracture or focal lesion. Sinuses/Orbits: Complete opacification of the RIGHT maxillary sinus with bony thickening compatible with chronic sinusitis. No air-fluid levels in the remaining sinuses with minimal scattered ethmoid opacification. Other: None CT CERVICAL SPINE FINDINGS Alignment: Areas of variable antra retrolisthesis very mild in the cervical spine associated with marked degenerative changes. 1-2 mm anterolisthesis of C3 on C4. 3 mm retrolisthesis of C4 on C5. 3 mm anterolisthesis of C6 on C7. Preservation and mild accentuation of normal cervical lordotic curvature. 3 mm anterolisthesis of T1 on T2 is noted also with surrounding degenerative changes and approximately 2 mm anterolisthesis of C2 on T3. Skull base and vertebrae: No acute fracture. No primary bone lesion or focal pathologic process. Soft tissues and spinal canal: No prevertebral fluid or swelling. No visible canal hematoma. Disc levels: Multilevel disc space narrowing and facet arthropathy associated with areas of antro and retrolisthesis as discussed above. Degenerative changes are marked and worse in the mid and lower cervical spine. Upper chest: Pulmonary emphysema, moderate at the lung apices. Other: None IMPRESSION: 1. No acute intracranial abnormality. 2. Signs of chronic microvascular ischemic change and generalized atrophy. 3. No  evidence for acute traumatic injury to the cervical spine. 4. Multilevel degenerative changes as discussed with associated changes and alignment likely related to profound disc space narrowing and multilevel facet arthropathy. Not associated with prevertebral soft tissue swelling. 5. Pulmonary emphysema, moderate at the lung apices. 6. Chronic RIGHT maxillary sinusitis complete opacification of the RIGHT maxillary sinus is noted. Correlate with any symptoms of sinus disease. Emphysema (ICD10-J43.9). Electronically Signed   By: Zetta Bills M.D.   On: 10/18/2021 14:51   CT Cervical Spine Wo Contrast  Result Date: 10/18/2021 CLINICAL DATA:  A female at age 55 presents for evaluation of neck trauma, multiple falls over the past 2 days EXAM: CT HEAD WITHOUT CONTRAST CT CERVICAL SPINE WITHOUT CONTRAST TECHNIQUE: Multidetector CT imaging of the head and cervical spine was performed following the standard protocol without intravenous contrast. Multiplanar CT image reconstructions of the cervical spine were also generated. RADIATION DOSE REDUCTION:  This exam was performed according to the departmental dose-optimization program which includes automated exposure control, adjustment of the mA and/or kV according to patient size and/or use of iterative reconstruction technique. COMPARISON:  None. None FINDINGS: CT HEAD FINDINGS Brain: No evidence of acute infarction, hemorrhage, hydrocephalus, extra-axial collection or mass lesion/mass effect. Signs of chronic microvascular ischemic change and generalized atrophy. Vascular: No hyperdense vessel or unexpected calcification. Skull: Normal. Negative for fracture or focal lesion. Sinuses/Orbits: Complete opacification of the RIGHT maxillary sinus with bony thickening compatible with chronic sinusitis. No air-fluid levels in the remaining sinuses with minimal scattered ethmoid opacification. Other: None CT CERVICAL SPINE FINDINGS Alignment: Areas of variable antra  retrolisthesis very mild in the cervical spine associated with marked degenerative changes. 1-2 mm anterolisthesis of C3 on C4. 3 mm retrolisthesis of C4 on C5. 3 mm anterolisthesis of C6 on C7. Preservation and mild accentuation of normal cervical lordotic curvature. 3 mm anterolisthesis of T1 on T2 is noted also with surrounding degenerative changes and approximately 2 mm anterolisthesis of C2 on T3. Skull base and vertebrae: No acute fracture. No primary bone lesion or focal pathologic process. Soft tissues and spinal canal: No prevertebral fluid or swelling. No visible canal hematoma. Disc levels: Multilevel disc space narrowing and facet arthropathy associated with areas of antro and retrolisthesis as discussed above. Degenerative changes are marked and worse in the mid and lower cervical spine. Upper chest: Pulmonary emphysema, moderate at the lung apices. Other: None IMPRESSION: 1. No acute intracranial abnormality. 2. Signs of chronic microvascular ischemic change and generalized atrophy. 3. No evidence for acute traumatic injury to the cervical spine. 4. Multilevel degenerative changes as discussed with associated changes and alignment likely related to profound disc space narrowing and multilevel facet arthropathy. Not associated with prevertebral soft tissue swelling. 5. Pulmonary emphysema, moderate at the lung apices. 6. Chronic RIGHT maxillary sinusitis complete opacification of the RIGHT maxillary sinus is noted. Correlate with any symptoms of sinus disease. Emphysema (ICD10-J43.9). Electronically Signed   By: Zetta Bills M.D.   On: 10/18/2021 14:51   DG Chest Portable 1 View  Result Date: 10/18/2021 CLINICAL DATA:  Fall, tachycardia EXAM: PORTABLE CHEST 1 VIEW COMPARISON:  Chest x-ray 01/09/2010 FINDINGS: Heart size and mediastinal contours are within normal limits. Stable likely calcified granuloma in the left upper lobe. Mildly prominent chronic interstitial lung markings. No suspicious  focal opacities identified. No pleural effusion or pneumothorax visualized. No acute osseous abnormality appreciated. Old left-sided rib fractures noted. IMPRESSION: Chronic findings with no acute intrathoracic process identified. Electronically Signed   By: Ofilia Neas M.D.   On: 10/18/2021 13:36    Procedures Procedures    Medications Ordered in ED Medications  nystatin (MYCOSTATIN/NYSTOP) topical powder (has no administration in time range)  sodium chloride 0.9 % bolus 1,000 mL (1,000 mLs Intravenous New Bag/Given 10/18/21 1342)    ED Course/ Medical Decision Making/ A&P                           Medical Decision Making Amount and/or Complexity of Data Reviewed Labs: ordered. Radiology: ordered.  Risk Prescription drug management.   Maria Maria [name] is a 88 y.o. female with a past medical history significant for thyroid disease, hypertension, and previous hysterectomy who presents for fatigue and fall.  According to patient, she has fallen twice in the last 3 days and was on the ground for at least overnight.  She think she had a mechanical  fall but was unable to get up.  She denies any pain and denies hitting her head but reports she is very fatigued and tired.  She agreed she urinated on herself and has had urinary frequency.  She denies any fevers, chills, chest pain, shortness breath, nausea, vomiting, constipation, or diarrhea.  She reports bruising and redness to her right side where she laid primarily.  She denies any pains aside from her right shoulder.  She agrees that she has bruising on her right head.  She denies history of A-fib but EMS found her with a rhythm of A-fib with a rate in the 150s.  She was given some Cardizem and rate improved to around 110 upon arrival.  Patient says she has felt tired for the last few days and has had some urinary frequency.  Denies other complaints.  On exam, lungs clear and chest nontender.  She is tachycardic with some irregularity and  rate around 1 10-1 20.  She is not hypotensive on arrival.  Chest nontender abdomen nontender.  Patient has redness likely from pressure but also bruising to her right head, right shoulder, and right side of the body.  She has evidence of likely yeast infection under her pannus but is nontender.  Bowel sounds appreciated.  She is moving all extremities.  Normal sensation and strength in extremities.  Pupils symmetric and reactive normal extract movements.  Mucous membranes are very dry.  Patient is feeling thirsty.  No tenderness of her back or neck.  No laceration seen.  Right shoulder is tender and swollen but she has range of motion.  Clinically I suspect patient has dehydration or even rhabdo from being on the ground overnight.  I am worried that her urinary frequency may indicate UTI leading her to her overall weakness, fatigue, and falls.  We will check to look for occult infection with chest x-ray and urinalysis and will get x-ray of the shoulder where she was having some pain.  We will get CT of the head and neck given her multiple falls.  We will give her some fluids and try to look for other cause of her new appearing atrial fibrillation.  Anticipate admission for further management after work-up is completed.  2:26 PM X-ray showed some abnormality on the right shoulder.  Rotational images were requested and were ordered.  Repeat x-ray did not show evidence of acute fracture.  Appears to be artifact.  LFTs are elevated so right upper ultrasound was ordered.  Patient also had ammonia and INR ordered.  Patient CK returned at nearly 10,000.  Suspect rhabdo from being on the ground.   Patient will need admission for rhabdo, dehydration, new A-fib, and LFT elevation.  Patient will have her right upper quadrant ultrasound followed up on and anticipate admission for fatigue and overall weakness and rehydration after.  3:13 PM Ultrasound shows no acute cholecystitis.  Unclear cause of the patient's  elevated LFTs but it could have been from laying on her right abdomen for an entire night.  No evidence of acute infection initially.  Just dehydration and rhabdo.  Will call for admission for the new A-fib, liver injury, and rhabdo with fatigue.         Final Clinical Impression(s) / ED Diagnoses Final diagnoses:  LFT elevation  Atrial fibrillation, unspecified type ([place])  Non-traumatic rhabdomyolysis    Clinical Impression: 1. Atrial fibrillation, unspecified type ([facility])   2. Pain   3. LFT elevation   4. Non-traumatic rhabdomyolysis  Disposition: Admit  This Maria [name] was prepared with assistance of Systems analyst. Occasional wrong-word or sound-a-like substitutions may have occurred due to the inherent limitations of voice recognition software.      Sanora Cunanan, Gwenyth Allegra, MD 10/18/21 1515

## 2021-10-18 NOTE — ED Notes (Signed)
Marylu Lund (patient's friend)- [phone]

## 2021-10-18 NOTE — ED Notes (Signed)
Lactic acid 2.2. Received from lab.

## 2021-10-18 NOTE — H&P (Signed)
History and Physical    Patient: Maria [name] DOB: [birth year] DOA: 10/18/2021 DOS: the patient was seen and examined on 10/18/2021 PCP: Lorenda Ishihara, MD  Patient coming from: Home  Chief Complaint:  Chief Complaint  Patient presents with   Fall    HPI: Maria [name] is a 88 y.o. female with medical history significant of HTN, hypothyroidism, GAD. Presenting after a fall. History is from chart review as patient is confused. Contact listed in chart is a family friend and is not entirely aware of what happened to the patient. Apparently the patient has had multiple falls over the last couple of days. She fell sometime last night and had been on the ground until she was found today. EMS was contacted and she was brought to the hospital for evaluation. She apparently has had fatigue and urinary frequency over the last few days. She denies any other complaints.    Review of Systems: As mentioned in the history of present illness. All other systems reviewed and are negative. Past Medical History:  Diagnosis Date   Hypertension    Thyroid disease    Past Surgical History:  Procedure Laterality Date   ABDOMINAL HYSTERECTOMY     Social History:  has no history on file for tobacco use, alcohol use, and drug use.  No Known Allergies  History reviewed. No pertinent family history.  Prior to Admission medications   Medication Sig Start Date End Date Taking? Authorizing Provider  ibuprofen (ADVIL,MOTRIN) 600 MG tablet Take 1 tablet (600 mg total) by mouth every 6 (six) hours. 04/11/16   Lyndal Pulley, MD    Physical Exam: Vitals:   10/18/21 1300 10/18/21 1336 10/18/21 1400 10/18/21 1542  BP: 129/76  126/75 134/72  Pulse: (!) 138  (!) 121 (!) 108  Resp: 20  19 20   Temp: 98 F (36.7 C)     SpO2: 97%  99% 98%  Weight:  59 kg    Height:  5\' 4"  (1.626 m)     General: 88 y.o. female resting in bed in NAD Eyes: PERRL, normal sclera ENMT: Nares patent w/o  discharge, orophaynx clear, dentition normal, ears w/o discharge/lesions/ulcers Neck: Supple, trachea midline Cardiovascular: tachy, irregular, +S1, S2, no m/g/r, equal pulses throughout Respiratory: CTABL, no w/r/r, normal WOB GI: BS+, NDNT, no masses noted, no organomegaly noted MSK: No e/c/c Neuro: A&O x 2 (name, year), no focal deficits Psyc: She is a little confused, but calm/cooperative   Data Reviewed:  AST  393 ALT  143 T bili 2.3 Lactic acid 2.2 WBC 25.5  CTH: No acute intracranial abnormality. CT c-spine: No evidence for acute traumatic injury to the cervical spine. XR Rt Shoulder: No suspicious lesions are seen. CXR: Chronic findings with no acute intrathoracic process identified. ab complete: Cholelithiasis.  No evidence of acute cholecystitis. Prominent common bile duct measuring 9.2 mm. No intrahepatic ductal dilation. Correlate with LFTs.  EKG: a fib, no st elevations  Assessment and Plan: No notes have been filed under this hospital service. Service: Hospitalist Rhabdomyolysis     - admit to inpt, progressive     - fluids, trend CK  Falls     - PT/OT eval  Elevated LFTs Elevated t bili     - expect some elevation with the elevated CK     - 83 ab shows dilated CBD (9.25mm); spoke with [facility] - normal for extra mm size beyond 6 mm per decade after 50 (so for her would be  normal to have CBD 61mm); trend LFTs, if not improving, get MRCP and formally consult GI  Leukocytosis Lactic acidosis Dehydration SIRS     - ?reactive     - UA is negative, CXR is negative, she doesn't report diarrhea or any respiratory symptoms     - right now, let's give fluids and trend lactic acid, add blood Cx  A fib, new onset Elevated trp     - trend trp     - rpt EKG     - CHA2DS2-VASc is 3     - dilt gtt, heparin gtt     - echo  Advance Care Planning:   Code Status: FULL  Consults: [facility]  Family Communication: Spoke with family friend, Herbert Moors, by phone  Severity of Illness: The appropriate patient status for this patient is INPATIENT. Inpatient status is judged to be reasonable and necessary in order to provide the required intensity of service to ensure the patient's safety. The patient's presenting symptoms, physical exam findings, and initial radiographic and laboratory data in the context of their chronic comorbidities is felt to place them at high risk for further clinical deterioration. Furthermore, it is not anticipated that the patient will be medically stable for discharge from the hospital within 2 midnights of admission.   * I certify that at the point of admission it is my clinical judgment that the patient will require inpatient hospital care spanning beyond 2 midnights from the point of admission due to high intensity of service, high risk for further deterioration and high frequency of surveillance required.*  Author: Teddy Spike, DO 10/18/2021 3:51 PM  For on call review [email or web address].

## 2021-10-18 NOTE — ED Triage Notes (Signed)
Per EMS, patient from home, multiple falls over the last two days with redness to right arm from carpet. Denies pain. Lives alone. Strong odor to urine. In afib rate 150 with EMS. 10mg  Cardizem given by EMS. Rate 110.   CBG 141 18 g L FA

## 2021-10-18 NOTE — Progress Notes (Signed)
ANTICOAGULATION CONSULT NOTE - Initial Consult  Pharmacy Consult for heparin Indication: atrial fibrillation  No Known Allergies  Patient Measurements: Height: 5\' 4"  (162.6 cm) Weight: 59 kg (130 lb) IBW/kg (Calculated) : 54.7 Heparin Dosing Weight: 59 kg  Vital Signs: Temp: 98 F (36.7 C) (02/16 1300) BP: 134/72 (02/16 1542) Pulse Rate: 108 (02/16 1542)  Labs: Recent Labs    10/18/21 1312  HGB 14.4  HCT 44.9  PLT 240  CREATININE 0.51  CKTOTAL 9,498*  TROPONINIHS 49*    Estimated Creatinine Clearance: 44.4 mL/min (by C-G formula based on SCr of 0.51 mg/dL).   Medical History: Past Medical History:  Diagnosis Date   Hypertension    Thyroid disease     Medications:  Scheduled:   nystatin   Topical Once    Assessment: 88 yo female presenting via EMS for fatigue and fall.  Patient reports falling twice in the last 3  days.  CT head obtained and was negative for any acute abnormality.  No history of Afib but was found to have Afib by EMS.  No anticoagulation noted PTA.  CHADS-VASc = 3.  Pharmacy is consulted to dose heparin drip for new-onset Afib.   CBC stable, baseline aPTT, PT/INR ordered.  Goal of Therapy:  Heparin level 0.3-0.7 units/ml Monitor platelets by anticoagulation protocol: Yes   Plan:  Heparin 2000 units bolus x1 Start heparin drip at 800 units/hr Follow-up 8 hour HL Daily CBC and HL while on heparin F/u ability to transition to PO anticoagulant  83, PharmD 10/18/2021 5:18 PM

## 2021-10-18 NOTE — ED Notes (Signed)
Patient alert and oriented x 4, unable to give complete details of events sorrounding last 2 days. Questionable "staying at neighbors and dogs"? No family here to verify with

## 2021-10-19 ENCOUNTER — Inpatient Hospital Stay (HOSPITAL_COMMUNITY): Payer: Medicare Other

## 2021-10-19 ENCOUNTER — Encounter (HOSPITAL_COMMUNITY): Payer: Self-pay | Admitting: Internal Medicine

## 2021-10-19 DIAGNOSIS — W19XXXA Unspecified fall, initial encounter: Secondary | ICD-10-CM

## 2021-10-19 DIAGNOSIS — E876 Hypokalemia: Secondary | ICD-10-CM

## 2021-10-19 DIAGNOSIS — F411 Generalized anxiety disorder: Secondary | ICD-10-CM

## 2021-10-19 DIAGNOSIS — R778 Other specified abnormalities of plasma proteins: Secondary | ICD-10-CM

## 2021-10-19 DIAGNOSIS — I4891 Unspecified atrial fibrillation: Secondary | ICD-10-CM

## 2021-10-19 DIAGNOSIS — E039 Hypothyroidism, unspecified: Secondary | ICD-10-CM

## 2021-10-19 DIAGNOSIS — I10 Essential (primary) hypertension: Secondary | ICD-10-CM

## 2021-10-19 DIAGNOSIS — R7989 Other specified abnormal findings of blood chemistry: Secondary | ICD-10-CM

## 2021-10-19 LAB — ECHOCARDIOGRAM COMPLETE
AR max vel: 1.31 cm2
AV Area VTI: 1.13 cm2
AV Area mean vel: 1.19 cm2
AV Mean grad: 5 mmHg
AV Peak grad: 9.2 mmHg
Ao pk vel: 1.52 m/s
Calc EF: 65 %
Height: 64 in
P 1/2 time: 493 ms
S' Lateral: 1.9 cm
Single Plane A2C EF: 60.7 %
Single Plane A4C EF: 66 %
Weight: 2080 [oz_av]

## 2021-10-19 LAB — CBC
HCT: 38 % (ref 36.0–46.0)
Hemoglobin: 12.3 g/dL (ref 12.0–15.0)
MCH: 28.7 pg (ref 26.0–34.0)
MCHC: 32.4 g/dL (ref 30.0–36.0)
MCV: 88.6 fL (ref 80.0–100.0)
Platelets: 197 10*3/uL (ref 150–400)
RBC: 4.29 MIL/uL (ref 3.87–5.11)
RDW: 16.2 % — ABNORMAL HIGH (ref 11.5–15.5)
WBC: 15.6 10*3/uL — ABNORMAL HIGH (ref 4.0–10.5)
nRBC: 0 % (ref 0.0–0.2)

## 2021-10-19 LAB — COMPREHENSIVE METABOLIC PANEL
ALT: 115 U/L — ABNORMAL HIGH (ref 0–44)
AST: 247 U/L — ABNORMAL HIGH (ref 15–41)
Albumin: 3.5 g/dL (ref 3.5–5.0)
Alkaline Phosphatase: 45 U/L (ref 38–126)
Anion gap: 9 (ref 5–15)
BUN: 26 mg/dL — ABNORMAL HIGH (ref 8–23)
CO2: 25 mmol/L (ref 22–32)
Calcium: 8.2 mg/dL — ABNORMAL LOW (ref 8.9–10.3)
Chloride: 110 mmol/L (ref 98–111)
Creatinine, Ser: 0.42 mg/dL — ABNORMAL LOW (ref 0.44–1.00)
GFR, Estimated: >60 mL/min (ref >60)
Glucose, Bld: 104 mg/dL — ABNORMAL HIGH (ref 70–99)
Potassium: 2.7 mmol/L — CL (ref 3.5–5.1)
Sodium: 144 mmol/L (ref 135–145)
Total Bilirubin: 0.8 mg/dL (ref 0.3–1.2)
Total Protein: 6.4 g/dL — ABNORMAL LOW (ref 6.5–8.1)

## 2021-10-19 LAB — HEPATITIS PANEL, ACUTE
HCV Ab: NONREACTIVE
Hep A IgM: NONREACTIVE
Hep B C IgM: NONREACTIVE
Hepatitis B Surface Ag: NONREACTIVE

## 2021-10-19 LAB — MAGNESIUM: Magnesium: 2.4 mg/dL (ref 1.7–2.4)

## 2021-10-19 LAB — POTASSIUM: Potassium: 3.2 mmol/L — ABNORMAL LOW (ref 3.5–5.1)

## 2021-10-19 LAB — HEPARIN LEVEL (UNFRACTIONATED)
Heparin Unfractionated: 0.14 IU/mL — ABNORMAL LOW (ref 0.30–0.70)
Heparin Unfractionated: >1.1 IU/mL — ABNORMAL HIGH (ref 0.30–0.70)
Heparin Unfractionated: 0.24 IU/mL — ABNORMAL LOW (ref 0.30–0.70)

## 2021-10-19 LAB — CK: Total CK: 5758 U/L — ABNORMAL HIGH (ref 38–234)

## 2021-10-19 MED ORDER — ATENOLOL 50 MG PO TABS
75.0000 mg | ORAL_TABLET | Freq: Every day | ORAL | Status: DC
  Administered 2021-10-19 – 2021-10-22 (×4): 75 mg via ORAL
  Filled 2021-10-19 (×4): qty 1

## 2021-10-19 MED ORDER — POTASSIUM CHLORIDE CRYS ER 20 MEQ PO TBCR: 40.0000 meq | EXTENDED_RELEASE_TABLET | ORAL | Status: DC

## 2021-10-19 MED ORDER — LACTATED RINGERS IV SOLN: INTRAVENOUS | Status: DC

## 2021-10-19 MED ORDER — LEVOTHYROXINE SODIUM 88 MCG PO TABS
88.0000 ug | ORAL_TABLET | Freq: Every day | ORAL | Status: DC
  Administered 2021-10-20 – 2021-10-22 (×3): 88 ug via ORAL
  Filled 2021-10-19 (×3): qty 1

## 2021-10-19 MED ORDER — POTASSIUM CHLORIDE 10 MEQ/100ML IV SOLN
10.0000 meq | INTRAVENOUS | Status: AC
  Administered 2021-10-19 (×3): 10 meq via INTRAVENOUS
  Filled 2021-10-19 (×3): qty 100

## 2021-10-19 MED ORDER — POTASSIUM CHLORIDE CRYS ER 20 MEQ PO TBCR: 40.0000 meq | EXTENDED_RELEASE_TABLET | Freq: Two times a day (BID) | ORAL | Status: DC

## 2021-10-19 MED ORDER — SERTRALINE HCL 50 MG PO TABS
50.0000 mg | ORAL_TABLET | Freq: Every day | ORAL | Status: DC
Start: 2021-10-19 — End: 2021-10-22
  Administered 2021-10-20 – 2021-10-22 (×3): 50 mg via ORAL
  Filled 2021-10-19 (×3): qty 1

## 2021-10-19 NOTE — ED Notes (Signed)
Discussed with Hospitalist, Dr. Caleb Popp, pt's episode of bradycardia this morning. Per Dr. Caleb Popp, pt to be re-started on Diltiazem drip at 5mg /hr. Additionally, pt NPO at this time d/t "episode of aspiration" per nightshift RN when patient was taking morning medications. Speech consult ordered at this time and pt will remain NPO until speech consult is completed, per Dr. verbal order.

## 2021-10-19 NOTE — Assessment & Plan Note (Addendum)
Patient is on Zoloft as an outpatient. -Continue home Zoloft

## 2021-10-19 NOTE — Assessment & Plan Note (Addendum)
Likely secondary to rhabdomyolysis. Improving with treatment of rhabdomyolysis. Pt denies any nausea, vomiting or abdominal pain.

## 2021-10-19 NOTE — Assessment & Plan Note (Addendum)
Mild elevated. No history concerning for ACS. In setting of atrial fibrillation with RVR. Transthoracic Echocardiogram ordered on admission and is without LV dysfunction or wall motion abnormalities.

## 2021-10-19 NOTE — Assessment & Plan Note (Addendum)
New onset. Patient started on diltiazem IV and heparin IV on admission. Transthoracic Echocardiogram ordered and is without significant valvular disease or LV dysfunction. Patient is on atenolol as an outpatient and withdrawal may be precipitating factor. TSH normal. CHA2DS2-VASc Score is 4. Patient was transitioned to atenolol and Eliquis but patient declined Eliquis on discharge; patient aware of stroke risk reduction with Eliquis use. Patient declined cardiology follow-up on discharge.

## 2021-10-19 NOTE — Assessment & Plan Note (Addendum)
Continue home Synthroid. TSH wnl.

## 2021-10-19 NOTE — Evaluation (Signed)
Physical Therapy Evaluation Patient Details Name: Maria [name] MRN: [id number] DOB: 06/07/1934 Today's Date: 10/19/2021  History of Present Illness  Patient is an 88 year old female presenting after a fall. Found down on ground after fall overnight. PMH: HTN, hypothyroidism, GAD. Negative for fracture/injury of right UE.  Clinical Impression  The patient  resides at home alone with  her dogs. Patient fell at  a friend's home,taking care of dogs.  Patient is very independent PTA.Patient's family lives  out of town.  Patient  has limited use of the RUE with shoulder pain. Reports right hip pain and buttock(noted redness.) patient has multiple bruises on arms  and legs.  Patient requiring  mod assistance of 2  to sit up and transfer to and from a  chair. Patient incontinent of malodorous urine.  Patient  will benefit from [facility] for rehab.  Pt admitted with above diagnosis.  Pt currently with functional limitations due to the deficits listed below (see PT Problem List). Pt will benefit from skilled PT to increase their independence and safety with mobility to allow discharge to the venue listed below.        Recommendations for follow up therapy are one component of a multi-disciplinary discharge planning process, led by the attending physician.  Recommendations may be updated based on patient status, additional functional criteria and insurance authorization.  Follow Up Recommendations Skilled nursing-short term rehab (<3 hours/day)    Assistance Recommended at Discharge Frequent or constant Supervision/Assistance  Patient can return home with the following  A lot of help with walking and/or transfers;A lot of help with bathing/dressing/bathroom;Direct supervision/assist for medications management;Assist for transportation;Assistance with cooking/housework;Help with stairs or ramp for entrance    Equipment Recommendations Rolling walker (2 wheels)  Recommendations for Other Services        Functional Status Assessment Patient has had a recent decline in their functional status and demonstrates the ability to make significant improvements in function in a reasonable and predictable amount of time.     Precautions / Restrictions Precautions Precautions: Fall Precaution Comments: Urine incontinence Restrictions Weight Bearing Restrictions: No      Mobility  Bed Mobility Overal bed mobility: Needs Assistance Bed Mobility: Supine to Sit, Sit to Supine     Supine to sit: HOB elevated, Mod assist Sit to supine: Mod assist   General bed mobility comments: Mod A to assist with trunk out of bed and scoot forward. Mod A to lift legs back onto bed    Transfers Overall transfer level: Needs assistance Equipment used: Rolling walker (2 wheels) Transfers: Sit to/from Stand, Bed to chair/wheelchair/BSC Sit to Stand: Mod assist, +2 safety/equipment   Step pivot transfers: Mod assist, +2 physical assistance, +2 safety/equipment       General transfer comment: patient stands at Rw but unable to  place RUE to support  due to pain and decreased ROM. patient stood and stepped to chair rhen back to stretcher, incontinent along the way    Ambulation/Gait                  Stairs            Wheelchair Mobility    Modified Rankin (Stroke Patients Only)       Balance Overall balance assessment: History of Falls, Needs assistance Sitting-balance support: Feet supported Sitting balance-Leahy Scale: Fair     Standing balance support: Single extremity supported, Bilateral upper extremity supported, Reliant on assistive device for balance Standing balance-Leahy Scale: Poor  Pertinent Vitals/Pain Pain Assessment Faces Pain Scale: Hurts even more Pain Location: bottom and right shoulder, right hip Pain Descriptors / Indicators: Discomfort, Grimacing, Guarding Pain Intervention(s): Monitored during session, Repositioned     Home Living Family/patient expects to be discharged to:: Private residence Living Arrangements: Alone Available Help at Discharge: Family;Available PRN/intermittently Type of Home: House Home Access: Stairs to enter   Entrance Stairs-Number of Steps: 3 wide steps   Home Layout: One level Home Equipment: Conservation officer, nature (2 wheels);Cane - single point;Wheelchair - manual;Shower seat;BSC/3in1 Additional Comments: patient was sitting  for friends dogs inot in her home when she fell.    Prior Function Prior Level of Function : Independent/Modified Independent;Driving                     Hand Dominance   Dominant Hand: Right    Extremity/Trunk Assessment   Upper Extremity Assessment Upper Extremity Assessment: Defer to OT evaluation RUE Deficits / Details: significantly limited R shoulder ROM due to pain although imaging negative. ~30 degrees flexion. Tearing and red irritation at elbow as patient states she was getting around on the floor using her elbows when down    Lower Extremity Assessment Lower Extremity Assessment: Generalized weakness    Cervical / Trunk Assessment Cervical / Trunk Assessment: Normal  Communication   Communication: No difficulties  Cognition Arousal/Alertness: Awake/alert Behavior During Therapy: WFL for tasks assessed/performed Overall Cognitive Status: Within Functional Limits for tasks assessed                                 General Comments: patient not clear as tho the events leading to her fall, dark in the home, not  knowing the layout        General Comments      Exercises     Assessment/Plan    PT Assessment Patient needs continued PT services  PT Problem List Decreased strength;Decreased mobility;Decreased safety awareness;Decreased range of motion;Decreased coordination;Decreased activity tolerance;Pain;Decreased balance;Decreased knowledge of precautions       PT Treatment Interventions Therapeutic  activities;Gait training;Therapeutic exercise;Patient/family education;Functional mobility training;Balance training    PT Goals (Current goals can be found in the Care Plan section)  Acute Rehab PT Goals Patient Stated Goal: to go home PT Goal Formulation: With patient Time For Goal Achievement: 11/02/21 Potential to Achieve Goals: Good    Frequency Min 2X/week     Co-evaluation PT/OT/SLP Co-Evaluation/Treatment: Yes Reason for Co-Treatment: For patient/therapist safety PT goals addressed during session: Mobility/safety with mobility OT goals addressed during session: ADL's and self-care       AM-PAC PT "6 Clicks" Mobility  Outcome Measure Help needed turning from your back to your side while in a flat bed without using bedrails?: A Lot Help needed moving from lying on your back to sitting on the side of a flat bed without using bedrails?: A Lot Help needed moving to and from a bed to a chair (including a wheelchair)?: A Lot Help needed standing up from a chair using your arms (e.g., wheelchair or bedside chair)?: A Lot Help needed to walk in hospital room?: Total Help needed climbing 3-5 steps with a railing? : Total 6 Click Score: 10    End of Session   Activity Tolerance: Patient limited by pain;Patient tolerated treatment well Patient left: in bed;with call bell/phone within reach Nurse Communication: Mobility status PT Visit Diagnosis: Unsteadiness on feet (R26.81);Pain;History of falling (Z91.81);Difficulty in walking,  not elsewhere classified (R26.2) Pain - Right/Left: Right Pain - part of body: Shoulder    Time: [id number] PT Time Calculation (min) (ACUTE ONLY): 31 min   Charges:   PT Evaluation $PT Eval Moderate Complexity: [place] Pager [phone] Office [phone]   Claretha Cooper 10/19/2021, 3:05 PM

## 2021-10-19 NOTE — ED Notes (Signed)
RN made aware by tech staff that patient choked on water just now.  Hospitalist made aware.

## 2021-10-19 NOTE — Assessment & Plan Note (Addendum)
Potassium as low as 2.7. Given potassium supplementation. Resolved.

## 2021-10-19 NOTE — Progress Notes (Signed)
° °  Echocardiogram 2D Echocardiogram has been performed.  Festus Barren 10/19/2021, 8:11 AM

## 2021-10-19 NOTE — Assessment & Plan Note (Addendum)
In setting of fall and time down. CK of 9,498 on admission with associated AST/ALT of 393/143 and bilirubin of 2.3. NS IV fluids initiated. CK levels have improved to 1.033 and AST/ALT down to 90/81 prior to discharge

## 2021-10-19 NOTE — ED Notes (Signed)
Hospitalist, Dr. Lonny Prude paged by unit secretary regarding pt's Diltiazem drip being paused by nightshift RN d/t HR bradying down into the 40's. Pt's HR in the high 90's at this time.

## 2021-10-19 NOTE — Assessment & Plan Note (Addendum)
No source. Possibly related to dehydration. Urine cultures obtained. No blood cultures obtained. No antibiotics initiated. WBC trending down.

## 2021-10-19 NOTE — ED Notes (Signed)
Echo at the bedside °

## 2021-10-19 NOTE — Progress Notes (Signed)
ANTICOAGULATION CONSULT NOTE   Pharmacy Consult for heparin Indication: atrial fibrillation  Allergies  Allergen Reactions   Clindamycin Shortness Of Breath, Rash and Other (See Comments)    Possible shortness of breath- definitely caused a congested nose   Diclofenac Sodium Rash and Other (See Comments)    Nasal congestion, also   Nsaids Rash and Other (See Comments)    Nasal congestion, also    Patient Measurements: Height: 5\' 4"  (162.6 cm) Weight: 59 kg (130 lb) IBW/kg (Calculated) : 54.7 Heparin Dosing Weight: 59 kg  Vital Signs: Temp: 98 F (36.7 C) (02/17 1615) Temp Source: Oral (02/17 0539) BP: 131/73 (02/17 1615) Pulse Rate: 87 (02/17 1615)  Labs: Recent Labs    10/18/21 1312 10/18/21 1717 10/18/21 1830 10/18/21 1932 10/19/21 0500 10/19/21 1444  HGB 14.4  --   --   --  12.3  --   HCT 44.9  --   --   --  38.0  --   PLT 240  --   --   --  197  --   APTT  --  27  --   --   --   --   LABPROT  --   --  14.6  --   --   --   INR  --   --  1.1  --   --   --   HEPARINUNFRC  --   --   --   --  0.14* >1.10*  CREATININE 0.51  --   --   --  0.42*  --   CKTOTAL 9,498*  --   --   --  5,758*  --   TROPONINIHS 49*  --   --  52*  --   --      Estimated Creatinine Clearance: 44.4 mL/min (A) (by C-G formula based on SCr of 0.42 mg/dL (L)).   Medical History: Past Medical History:  Diagnosis Date   Hypertension    Thyroid disease     Medications:  Scheduled:   atenolol  75 mg Oral Daily   [START ON 10/20/2021] levothyroxine  88 mcg Oral Q0600   sertraline  50 mg Oral Daily    Assessment: 88 yo female presenting via EMS for fatigue and fall.  Patient reports falling twice in the last 3  days.  CT head obtained and was negative for any acute abnormality.  No history of Afib but was found to have Afib by EMS.  No anticoagulation noted PTA.  CHADS-VASc = 3.  Pharmacy is consulted to dose heparin drip for new-onset Afib.   10/19/21 Heparin level ~1444 = >1.1 on  heparin 950 units/hr.  Given that previous heparin level's were nearly undetectable, stat redraw was ordered Repeat heparin level = 0.24 which is subtherapeutic on heparin 950 units/hr Hgb, Pltc - wnl No complications of therapy noted  Goal of Therapy:  Heparin level 0.3-0.7 units/ml Monitor platelets by anticoagulation protocol: Yes   Plan:  Increase heparin to 1050 units/hr Check HL in 8 hours after heparin drip increase Daily CBC and HL while on heparin F/u ability to transition to PO anticoagulant  10/21/21, PharmD 10/19/2021 5:11 PM

## 2021-10-19 NOTE — Evaluation (Signed)
Clinical/Bedside Swallow Evaluation Patient Details  Name: Maria [name] MRN: [id number] Date of Birth: 1934-01-19  Today's Date: 10/19/2021 Time: SLP Start Time (ACUTE ONLY): 1113 SLP Stop Time (ACUTE ONLY): 1128 SLP Time Calculation (min) (ACUTE ONLY): 15 min  Past Medical History:  Past Medical History:  Diagnosis Date   Hypertension    Thyroid disease    Past Surgical History:  Past Surgical History:  Procedure Laterality Date   ABDOMINAL HYSTERECTOMY     HPI:  Pt is an 88 yo female with multiple falls over the last few days, presenting after being found down. Admitted with rhabdomyolysis; UA, CXR, CTH negative. Pt with concern for aspiration while in ED. PMH includes: HTN, thyroid disease    Assessment / Plan / Recommendation  Clinical Impression  Pt's oropharyngeal swallowing appears to be [facility]. She has no overt s/s of aspiration and believes that she got choked overnight because she tried to drink while she was not sitting upright. SLP initially questioned the clarity of her speech, but pt reported that it was just because her lips were dry and she did have improvement after drinking water. Recommend regular solids and thin liquids. No SLP f/u indicated for swallowing at this time. SLP Visit Diagnosis: Dysphagia, unspecified (R13.10)    Aspiration Risk  Mild aspiration risk    Diet Recommendation Regular;Thin liquid   Liquid Administration via: Cup;Straw Medication Administration: Whole meds with liquid Supervision: Patient able to self feed;Intermittent supervision to cue for compensatory strategies Compensations: Slow rate;Small sips/bites Postural Changes: Seated upright at 90 degrees    Other  Recommendations Oral Care Recommendations: Oral care BID    Recommendations for follow up therapy are one component of a multi-disciplinary discharge planning process, led by the attending physician.  Recommendations may be updated based on patient status, additional functional  criteria and insurance authorization.  Follow up Recommendations No SLP follow up      Assistance Recommended at Discharge PRN  Functional Status Assessment Patient has not had a recent decline in their functional status  Frequency and Duration            Prognosis        Swallow Study   General HPI: Pt is an 88 yo female with multiple falls over the last few days, presenting after being found down. Admitted with rhabdomyolysis; UA, CXR, CTH negative. Pt with concern for aspiration while in ED. PMH includes: HTN, thyroid disease Type of Study: Bedside Swallow Evaluation Previous Swallow Assessment: none in chart Diet Prior to this Study: NPO Temperature Spikes Noted: No Respiratory Status: Room air History of Recent Intubation: No Behavior/Cognition: Alert;Cooperative;Pleasant mood Oral Cavity Assessment: Within Functional Limits Oral Care Completed by SLP: No Oral Cavity - Dentition:  (partial dentures) Vision: Functional for self-feeding Self-Feeding Abilities: Able to feed self Patient Positioning: Upright in bed Baseline Vocal Quality: Normal Volitional Cough: Strong Volitional Swallow: Able to elicit    Oral/Motor/Sensory Function Overall Oral Motor/Sensory Function: Within functional limits   Ice Chips Ice chips: Not tested   Thin Liquid Thin Liquid: Within functional limits Presentation: Cup;Self Fed;Straw    Nectar Thick Nectar Thick Liquid: Not tested   Honey Thick Honey Thick Liquid: Not tested   Puree Puree: Within functional limits Presentation: Self Fed;Spoon   Solid     Solid: Within functional limits Presentation: Self Fed      Mahala Menghini., M.A. CCC-SLP Acute Rehabilitation Services Pager [phone] Office [phone]  10/19/2021,1:36 PM

## 2021-10-19 NOTE — Hospital Course (Addendum)
Maria [name] is a 88 y.o. female with a history of hypertension, hypothyroidism, anxiety. Patient presented after falling down while in the dark and was subsequently unable to get up. She was found to have evidence of rhabdomyolysis and atrial fibrillation with RVR on admission. IV fluids and diltiazem drip initiated, along with heparin IV. Patient transitioned to home atenolol in addition to Eliquis. Continued IV fluids for rhabdomyolysis.

## 2021-10-19 NOTE — Progress Notes (Signed)
ANTICOAGULATION CONSULT NOTE   Pharmacy Consult for heparin Indication: atrial fibrillation  Allergies  Allergen Reactions   Clindamycin Shortness Of Breath, Rash and Other (See Comments)    Possible shortness of breath- definitely caused a congested nose   Diclofenac Sodium Rash and Other (See Comments)    Nasal congestion, also   Nsaids Rash and Other (See Comments)    Nasal congestion, also    Patient Measurements: Height: 5\' 4"  (162.6 cm) Weight: 59 kg (130 lb) IBW/kg (Calculated) : 54.7 Heparin Dosing Weight: 59 kg  Vital Signs: Temp: 98.7 F (37.1 C) (02/17 0539) Temp Source: Oral (02/17 0539) BP: 112/68 (02/17 0600) Pulse Rate: 88 (02/17 0600)  Labs: Recent Labs    10/18/21 1312 10/18/21 1717 10/18/21 1830 10/18/21 1932 10/19/21 0500  HGB 14.4  --   --   --  12.3  HCT 44.9  --   --   --  38.0  PLT 240  --   --   --  197  APTT  --  27  --   --   --   LABPROT  --   --  14.6  --   --   INR  --   --  1.1  --   --   HEPARINUNFRC  --   --   --   --  0.14*  CREATININE 0.51  --   --   --  0.42*  CKTOTAL 9,498*  --   --   --   --   TROPONINIHS 49*  --   --  52*  --      Estimated Creatinine Clearance: 44.4 mL/min (A) (by C-G formula based on SCr of 0.42 mg/dL (L)).   Medical History: Past Medical History:  Diagnosis Date   Hypertension    Thyroid disease     Medications:  Scheduled:   potassium chloride  40 mEq Oral BID    Assessment: 88 yo female presenting via EMS for fatigue and fall.  Patient reports falling twice in the last 3  days.  CT head obtained and was negative for any acute abnormality.  No history of Afib but was found to have Afib by EMS.  No anticoagulation noted PTA.  CHADS-VASc = 3.  Pharmacy is consulted to dose heparin drip for new-onset Afib.   10/19/21 Heparin level = 0.14 (subtherapeutic) with heparin gtt @ 800 units/hr Hgb, Pltc - wnl No complications of therapy noted  Goal of Therapy:  Heparin level 0.3-0.7  units/ml Monitor platelets by anticoagulation protocol: Yes   Plan:  Increase heparin drip to 950 units/hr Check HL 8 hour after heparin rate increased Daily CBC and HL while on heparin F/u ability to transition to PO anticoagulant  10/21/21, PharmD 10/19/2021 6:08 AM

## 2021-10-19 NOTE — ED Notes (Signed)
PT at the bedside.

## 2021-10-19 NOTE — Evaluation (Signed)
Occupational Therapy Evaluation Patient Details Name: Maria [name] MRN: [id number] DOB: 1934-04-20 Today's Date: 10/19/2021   History of Present Illness Patient is an 88 year old female presenting after a fall. Found down on ground after fall overnight. PMH: HTN, hypothyroidism, GAD   Clinical Impression   Patient lives home alone with her dogs and is very independent at baseline. Mows her lawn, drives, completely independent with self care tasks. Currently patient presenting with global weakness, limited R UE use due to painful shoulder and elbow, poor standing balance. Patient needing min to mod +2 for stability in standing and complete pivot transfer to/from chair. Total A for peri care after incontinent of urine and to change into clean brief. Unless patient were able to arrange 24/7 assistance at home will need rehab at D/C at this time, acute OT to follow.       Recommendations for follow up therapy are one component of a multi-disciplinary discharge planning process, led by the attending physician.  Recommendations may be updated based on patient status, additional functional criteria and insurance authorization.   Follow Up Recommendations  Skilled nursing-short term rehab (<3 hours/day)    Assistance Recommended at Discharge Frequent or constant Supervision/Assistance  Patient can return home with the following A lot of help with walking and/or transfers;A lot of help with bathing/dressing/bathroom;Assistance with cooking/housework;Assist for transportation;Help with stairs or ramp for entrance    Functional Status Assessment  Patient has had a recent decline in their functional status and demonstrates the ability to make significant improvements in function in a reasonable and predictable amount of time.  Equipment Recommendations  None recommended by OT       Precautions / Restrictions Precautions Precautions: Fall Precaution Comments: Urine  incontinence Restrictions Weight Bearing Restrictions: No      Mobility Bed Mobility Overal bed mobility: Needs Assistance Bed Mobility: Supine to Sit, Sit to Supine     Supine to sit: HOB elevated, Mod assist Sit to supine: Mod assist   General bed mobility comments: Mod A to assist with trunk out of bed and scoot forward. Mod A to lift legs back onto bed        Balance Overall balance assessment: History of Falls, Needs assistance Sitting-balance support: Feet supported Sitting balance-Leahy Scale: Fair     Standing balance support: Single extremity supported, Bilateral upper extremity supported Standing balance-Leahy Scale: Poor                             ADL either performed or assessed with clinical judgement   ADL Overall ADL's : Needs assistance/impaired     Grooming: Min guard;Sitting   Upper Body Bathing: Moderate assistance;Sitting   Lower Body Bathing: Maximal assistance;Sitting/lateral leans;Sit to/from stand   Upper Body Dressing : Moderate assistance;Sitting   Lower Body Dressing: Total assistance;Sitting/lateral leans;Sit to/from stand Lower Body Dressing Details (indicate cue type and reason): Patient with limited standing tolerance and balance needing total A to change into clean brief and perform pericare after incontinent urine Toilet Transfer: Minimal assistance;Moderate assistance;+2 for physical assistance;+2 for safety/equipment;Stand-pivot Toilet Transfer Details (indicate cue type and reason): Patient initially mod A +2 to power up to standing with difficulty maintaining upright posture and legs sliding forward. Sat back onto bed and with feet blocked patient better able to standing with min +2 and pivot to chair Toileting- Clothing Manipulation and Hygiene: Total assistance;Sitting/lateral lean;Sit to/from stand       Functional mobility  during ADLs: Minimal assistance;Moderate assistance;+2 for physical assistance;+2 for  safety/equipment General ADL Comments: Patient needing increased assistance with self care tasks due to impaired balance, strength, limited upper extremity ROM      Pertinent Vitals/Pain Pain Assessment Pain Assessment: Faces Faces Pain Scale: Hurts little more Pain Location: bottom Pain Descriptors / Indicators: Discomfort Pain Intervention(s): Monitored during session, Repositioned     Hand Dominance Right   Extremity/Trunk Assessment Upper Extremity Assessment Upper Extremity Assessment: RUE deficits/detail RUE Deficits / Details: significantly limited R shoulder ROM due to pain although imaging negative. ~30 degrees flexion. Tearing and red irritation at elbow as patient states she was getting around on the floor using her elbows when down   Lower Extremity Assessment Lower Extremity Assessment: Defer to PT evaluation       Communication Communication Communication: No difficulties   Cognition Arousal/Alertness: Awake/alert Behavior During Therapy: WFL for tasks assessed/performed Overall Cognitive Status: Within Functional Limits for tasks assessed                                                  Home Living Family/patient expects to be discharged to:: Private residence Living Arrangements: Alone Available Help at Discharge: Family;Available PRN/intermittently Type of Home: House Home Access: Stairs to enter [employer] of Steps: 3 wide steps   Home Layout: One level     Bathroom Shower/Tub: Producer, television/film/video: Handicapped height     Home Equipment: Agricultural consultant (2 wheels);Cane - single point;Wheelchair - manual;Shower seat;BSC/3in1          Prior Functioning/Environment Prior Level of Function : Independent/Modified Independent;Driving                        OT Problem List: Decreased strength;Decreased range of motion;Decreased activity tolerance;Impaired balance (sitting and/or  standing);Decreased safety awareness;Pain;Impaired UE functional use      OT Treatment/Interventions: Self-care/ADL training;Therapeutic exercise;Therapeutic activities;Patient/family education;Balance training    OT Goals(Current goals can be found in the care plan section) Acute Rehab OT Goals Patient Stated Goal: Go home OT Goal Formulation: With patient Time For Goal Achievement: 11/02/21 Potential to Achieve Goals: Good  OT Frequency: Min 2X/week    Co-evaluation PT/OT/SLP Co-Evaluation/Treatment: Yes Reason for Co-Treatment: For patient/therapist safety;To address functional/ADL transfers PT goals addressed during session: Mobility/safety with mobility OT goals addressed during session: ADL's and self-care      AM-PAC OT "6 Clicks" Daily Activity     Outcome Measure Help from another person eating meals?: Total (NPO) Help from another person taking care of personal grooming?: A Little Help from another person toileting, which includes using toliet, bedpan, or urinal?: A Lot Help from another person bathing (including washing, rinsing, drying)?: A Lot Help from another person to put on and taking off regular upper body clothing?: A Lot Help from another person to put on and taking off regular lower body clothing?: A Lot 6 Click Score: 12   End of Session Nurse Communication: Mobility status  Activity Tolerance: Patient tolerated treatment well Patient left: in bed;with call bell/phone within reach  OT Visit Diagnosis: Unsteadiness on feet (R26.81);Other abnormalities of gait and mobility (R26.89);Repeated falls (R29.6);Muscle weakness (generalized) (M62.81);History of falling (Z91.81)                Time: 3235-5732 OT Time Calculation (min): 31 min  Charges:  OT General Charges $OT Visit: 1 Visit OT Evaluation $OT Eval Low Complexity: 1 Low  Marlyce Huge OT OT pager: [phone]   Carmelia Roller 10/19/2021, 12:41 PM

## 2021-10-19 NOTE — Assessment & Plan Note (Signed)
Mild. Lactic acid of 2.2 on admission, down to 1.7 with IV fluids. Resolved.

## 2021-10-19 NOTE — Assessment & Plan Note (Addendum)
-  PT/OT consult Therapy eval recommending SNF.

## 2021-10-19 NOTE — ED Notes (Signed)
Verified with nightshift RN pt's current heparin drip infusing at 9.5units/hr.

## 2021-10-19 NOTE — Progress Notes (Signed)
PROGRESS NOTE    Maria [name]  [id number] DOB: [birth year] DOA: 10/18/2021 PCP: [name], MD   Brief Narrative: Maria [name] is a 88 y.o. female with a history of hypertension, hypothyroidism, anxiety. Patient presented after falling down while in the dark and was subsequently unable to get up. She was found to have evidence of rhabdomyolysis and atrial fibrillation with RVR on admission. IV fluids and diltiazem drip initiated, along with heparin IV.   Assessment and Plan: * Traumatic rhabdomyolysis ([place])- (present on admission) In setting of fall and time down. CK of 9498 on admission with associated AST/ALT of 393/143 and bilirubin of 2.3. NS IV fluids initiated. CK of 5758 today. -Trend CMP and CK daily -Discontinue NS IV fluids and start LR IV fluids  Generalized anxiety disorder Patient is on Zoloft as an outpatient. -Resume home Zoloft pending SLP evaluation  Hypothyroidism -Continue home Synthroid PO pending SLP evaluation. If unable to take PO, will start Synthroid IV  Primary hypertension Patient is on atenolol and hydrochlorothiazide as an outpatient. Blood pressure stable.  Hypokalemia -Potassium supplementation -Check magnesium  SIRS (systemic inflammatory response syndrome) (HCC) No source. Possibly related to dehydration. Urine cultures obtained. No blood cultures obtained. No antibiotics initiated. WBC trending down. -Monitor CBC  Elevated troponin Mild elevated. No history concerning for ACS. In setting of atrial fibrillation with RVR. Transthoracic Echocardiogram ordered on admission -Follow-up Transthoracic Echocardiogram  Falls -PT/OT consult  Elevated LFTs Likely secondary to rhabdomyolysis. Improving with treatment of rhabdomyolysis.  Paroxysmal atrial fibrillation with RVR ([place]) New onset. Patient started on diltiazem IV and heparin IV on admission. Transthoracic Echocardiogram ordered and is pending. Patient is on atenolol as  an outpatient and withdrawal may be precipitating factor. TSH normal. -Follow up Transthoracic Echocardiogram -Continue diltiazem drip pending Transthoracic Echocardiogram results -Continue heparin IV for now with likely transition to Eliquis -Resume home atenolol pending ability to take PO  Lactic acidosis-resolved as of 10/19/2021 Mild. Lactic acid of 2.2 on admission, down to 1.7 with IV fluids. Resolved.      DVT prophylaxis: Heparin IV Code Status:   Code Status: Full Code Family Communication: None at bedside Disposition Plan: Discharge to SNF pending transition to oral anticoagulation, discontinuation of IV diltiazem, improvement of rhabdomyolysis. Anticipate discharge in 3-5 days   Consultants:  None  Procedures:  Transthoracic Echocardiogram   Antimicrobials: None    Subjective: Patient had an episode of choking this morning but states this was after she was drinking water while lying down. No chest pain or dyspnea. Some shoulder pain.  Objective: BP 131/76    Pulse 98    Temp 98.7 F (37.1 C) (Oral)    Resp 20    Ht 5\' 4"  (1.626 m)    Wt 59 kg    SpO2 98%    BMI 22.31 kg/m   Examination:  General exam: Appears calm and comfortable  Respiratory system: Clear to auscultation. Respiratory effort normal. Cardiovascular system: S1 & S2 heard, irregular rhythm with normal rate. Gastrointestinal system: Abdomen is nondistended, soft and nontender. No organomegaly or masses felt. Normal bowel sounds heard. Central nervous system: Alert and oriented. No focal neurological deficits. Musculoskeletal: No edema. No calf tenderness Skin: No cyanosis. No rashes Psychiatry: Judgement and insight appear normal. Mood & affect appropriate.    Data Reviewed: I have personally reviewed following labs and imaging studies  CBC Lab Results  Component Value Date   WBC 15.6 (H) 10/19/2021   RBC 4.29 10/19/2021  HGB 12.3 10/19/2021   HCT 38.0 10/19/2021   MCV 88.6 10/19/2021    MCH 28.7 10/19/2021   PLT 197 10/19/2021   MCHC 32.4 10/19/2021   RDW 16.2 (H) 10/19/2021   LYMPHSABS 1.0 10/18/2021   MONOABS 2.7 (H) 10/18/2021   EOSABS 0.0 10/18/2021   BASOSABS 0.0 0000000     Last metabolic panel Lab Results  Component Value Date   NA 144 10/19/2021   K 2.7 (LL) 10/19/2021   CL 110 10/19/2021   CO2 25 10/19/2021   BUN 26 (H) 10/19/2021   CREATININE 0.42 (L) 10/19/2021   GLUCOSE 104 (H) 10/19/2021   GFRNONAA >60 10/19/2021   CALCIUM 8.2 (L) 10/19/2021   PROT 6.4 (L) 10/19/2021   ALBUMIN 3.5 10/19/2021   BILITOT 0.8 10/19/2021   ALKPHOS 45 10/19/2021   AST 247 (H) 10/19/2021   ALT 115 (H) 10/19/2021   ANIONGAP 9 10/19/2021    GFR: Estimated Creatinine Clearance: 44.4 mL/min (A) (by C-G formula based on SCr of 0.42 mg/dL (L)).  Recent Results (from the past 240 hour(s))  Resp Panel by RT-PCR (Flu A&B, Covid) Nasopharyngeal Swab     Status: None   Collection Time: 10/18/21  1:12 PM   Specimen: Nasopharyngeal Swab; Nasopharyngeal(NP) swabs in vial transport medium  Result Value Ref Range Status   SARS Coronavirus 2 by RT PCR NEGATIVE NEGATIVE Final    Comment: (NOTE) SARS-CoV-2 target nucleic acids are NOT DETECTED.  The SARS-CoV-2 RNA is generally detectable in upper respiratory specimens during the acute phase of infection. The lowest concentration of SARS-CoV-2 viral copies this assay can detect is 138 copies/mL. A negative result does not preclude SARS-Cov-2 infection and should not be used as the sole basis for treatment or other patient management decisions. A negative result may occur with  improper specimen collection/handling, submission of specimen other than nasopharyngeal swab, presence of viral mutation(s) within the areas targeted by this assay, and inadequate number of viral copies(<138 copies/mL). A negative result must be combined with clinical observations, patient history, and epidemiological information. The expected  result is Negative.  Fact Sheet for Patients:  EntrepreneurPulse.com.au  Fact Sheet for Healthcare Providers:  IncredibleEmployment.be  This test is no t yet approved or cleared by the [place] FDA and  has been authorized for detection and/or diagnosis of SARS-CoV-2 by FDA under an Emergency Use Authorization (EUA). This EUA will remain  in effect (meaning this test can be used) for the duration of the COVID-19 declaration under Section 564(b)(1) of the Act, 21 U.S.C.section 360bbb-3(b)(1), unless the authorization is terminated  or revoked sooner.       Influenza A by PCR NEGATIVE NEGATIVE Final   Influenza B by PCR NEGATIVE NEGATIVE Final    Comment: (NOTE) The Xpert Xpress SARS-CoV-2/FLU/RSV plus assay is intended as an aid in the diagnosis of influenza from Nasopharyngeal swab specimens and should not be used as a sole basis for treatment. Nasal washings and aspirates are unacceptable for Xpert Xpress SARS-CoV-2/FLU/RSV testing.  Fact Sheet for Patients: EntrepreneurPulse.com.au  Fact Sheet for Healthcare Providers: IncredibleEmployment.be  This test is not yet approved or cleared by the [place] FDA and has been authorized for detection and/or diagnosis of SARS-CoV-2 by FDA under an Emergency Use Authorization (EUA). This EUA will remain in effect (meaning this test can be used) for the duration of the COVID-19 declaration under Section 564(b)(1) of the Act, 21 U.S.C. section 360bbb-3(b)(1), unless the authorization is terminated or revoked.  Performed at [facility]  [address] [place], [place] [address]   Urine Culture     Status: None (Preliminary result)   Collection Time: 10/18/21  1:37 PM   Specimen: Urine, Clean Catch  Result Value Ref Range Status   Specimen Description   Final    URINE, CLEAN CATCH Performed at [facility], [place] [address] [place], [place] [address]    Special Requests   Final    NONE Performed at [facility], [place] [address], [place], [place] [address]    Culture   Final    CULTURE REINCUBATED FOR BETTER GROWTH Performed at [facility], [place] [address], [place], [place] [address]    Report Status PENDING  Incomplete      Radiology Studies: DG Shoulder Right  Result Date: 10/18/2021 CLINICAL DATA:  Fall, right shoulder pain and bruising EXAM: RIGHT SHOULDER - 2+ VIEW COMPARISON:  Shoulder radiographs obtained earlier the same day FINDINGS: There is marked degenerative change at the glenohumeral joint with a high-riding humeral head suggesting rotator cuff pathology. There are moderate degenerative changes of the [facility] joint. The previously described lucent lesion is favored to reflect artifact. There is no acute fracture or dislocation. IMPRESSION: 1. The previously described lucent lesion is favored to reflect artifact. No suspicious lesions are seen. 2. Marked degenerative changes about the shoulder as above. No acute fracture or dislocation. Electronically Signed   By: Valetta Mole M.D.   On: 10/18/2021 14:46   DG Shoulder Right  Result Date: 10/18/2021 CLINICAL DATA:  Found down, tachycardia EXAM: RIGHT SHOULDER - 2+ VIEW COMPARISON:  None FINDINGS: Osseous demineralization. Degenerative changes at [facility] joint with significant inferior acromial spur formation. Glenohumeral joint space narrowing and spur formation. Questionable lucent lesion within the proximal RIGHT humerus at the head/metaphyseal junction versus artifact from surrounding spurs. No acute fracture or dislocation. Remodeling of the undersurface of the acromion likely reflecting chronic rotator cuff tear. No acute rib abnormalities. IMPRESSION: Degenerative changes of the RIGHT acromioclavicular and glenohumeral joints knee without acute fracture or dislocation. Questionable lucent lesion at the head/metaphyseal  junction of the proximal RIGHT humerus versus artifact related to surrounding spur formation; consider internal and external rotation views of RIGHT shoulder when patient is clinically able. Electronically Signed   By: Lavonia Dana M.D.   On: 10/18/2021 13:57   CT HEAD WO CONTRAST (5MM)  Result Date: 10/18/2021 CLINICAL DATA:  A female at age 37 presents for evaluation of neck trauma, multiple falls over the past 2 days EXAM: CT HEAD WITHOUT CONTRAST CT CERVICAL SPINE WITHOUT CONTRAST TECHNIQUE: Multidetector CT imaging of the head and cervical spine was performed following the standard protocol without intravenous contrast. Multiplanar CT image reconstructions of the cervical spine were also generated. RADIATION DOSE REDUCTION: This exam was performed according to the departmental dose-optimization program which includes automated exposure control, adjustment of the mA and/or kV according to patient size and/or use of iterative reconstruction technique. COMPARISON:  None. None FINDINGS: CT HEAD FINDINGS Brain: No evidence of acute infarction, hemorrhage, hydrocephalus, extra-axial collection or mass lesion/mass effect. Signs of chronic microvascular ischemic change and generalized atrophy. Vascular: No hyperdense vessel or unexpected calcification. Skull: Normal. Negative for fracture or focal lesion. Sinuses/Orbits: Complete opacification of the RIGHT maxillary sinus with bony thickening compatible with chronic sinusitis. No air-fluid levels in the remaining sinuses with minimal scattered ethmoid opacification. Other: None CT CERVICAL SPINE FINDINGS Alignment: Areas of variable antra retrolisthesis very mild in  the cervical spine associated with marked degenerative changes. 1-2 mm anterolisthesis of C3 on C4. 3 mm retrolisthesis of C4 on C5. 3 mm anterolisthesis of C6 on C7. Preservation and mild accentuation of normal cervical lordotic curvature. 3 mm anterolisthesis of T1 on T2 is noted also with surrounding  degenerative changes and approximately 2 mm anterolisthesis of C2 on T3. Skull base and vertebrae: No acute fracture. No primary bone lesion or focal pathologic process. Soft tissues and spinal canal: No prevertebral fluid or swelling. No visible canal hematoma. Disc levels: Multilevel disc space narrowing and facet arthropathy associated with areas of antro and retrolisthesis as discussed above. Degenerative changes are marked and worse in the mid and lower cervical spine. Upper chest: Pulmonary emphysema, moderate at the lung apices. Other: None IMPRESSION: 1. No acute intracranial abnormality. 2. Signs of chronic microvascular ischemic change and generalized atrophy. 3. No evidence for acute traumatic injury to the cervical spine. 4. Multilevel degenerative changes as discussed with associated changes and alignment likely related to profound disc space narrowing and multilevel facet arthropathy. Not associated with prevertebral soft tissue swelling. 5. Pulmonary emphysema, moderate at the lung apices. 6. Chronic RIGHT maxillary sinusitis complete opacification of the RIGHT maxillary sinus is noted. Correlate with any symptoms of sinus disease. Emphysema (ICD10-J43.9). Electronically Signed   By: Zetta Bills M.D.   On: 10/18/2021 14:51   CT Cervical Spine Wo Contrast  Result Date: 10/18/2021 CLINICAL DATA:  A female at age 68 presents for evaluation of neck trauma, multiple falls over the past 2 days EXAM: CT HEAD WITHOUT CONTRAST CT CERVICAL SPINE WITHOUT CONTRAST TECHNIQUE: Multidetector CT imaging of the head and cervical spine was performed following the standard protocol without intravenous contrast. Multiplanar CT image reconstructions of the cervical spine were also generated. RADIATION DOSE REDUCTION: This exam was performed according to the departmental dose-optimization program which includes automated exposure control, adjustment of the mA and/or kV according to patient size and/or use of  iterative reconstruction technique. COMPARISON:  None. None FINDINGS: CT HEAD FINDINGS Brain: No evidence of acute infarction, hemorrhage, hydrocephalus, extra-axial collection or mass lesion/mass effect. Signs of chronic microvascular ischemic change and generalized atrophy. Vascular: No hyperdense vessel or unexpected calcification. Skull: Normal. Negative for fracture or focal lesion. Sinuses/Orbits: Complete opacification of the RIGHT maxillary sinus with bony thickening compatible with chronic sinusitis. No air-fluid levels in the remaining sinuses with minimal scattered ethmoid opacification. Other: None CT CERVICAL SPINE FINDINGS Alignment: Areas of variable antra retrolisthesis very mild in the cervical spine associated with marked degenerative changes. 1-2 mm anterolisthesis of C3 on C4. 3 mm retrolisthesis of C4 on C5. 3 mm anterolisthesis of C6 on C7. Preservation and mild accentuation of normal cervical lordotic curvature. 3 mm anterolisthesis of T1 on T2 is noted also with surrounding degenerative changes and approximately 2 mm anterolisthesis of C2 on T3. Skull base and vertebrae: No acute fracture. No primary bone lesion or focal pathologic process. Soft tissues and spinal canal: No prevertebral fluid or swelling. No visible canal hematoma. Disc levels: Multilevel disc space narrowing and facet arthropathy associated with areas of antro and retrolisthesis as discussed above. Degenerative changes are marked and worse in the mid and lower cervical spine. Upper chest: Pulmonary emphysema, moderate at the lung apices. Other: None IMPRESSION: 1. No acute intracranial abnormality. 2. Signs of chronic microvascular ischemic change and generalized atrophy. 3. No evidence for acute traumatic injury to the cervical spine. 4. Multilevel degenerative changes as discussed with associated changes and  alignment likely related to profound disc space narrowing and multilevel facet arthropathy. Not associated with  prevertebral soft tissue swelling. 5. Pulmonary emphysema, moderate at the lung apices. 6. Chronic RIGHT maxillary sinusitis complete opacification of the RIGHT maxillary sinus is noted. Correlate with any symptoms of sinus disease. Emphysema (ICD10-J43.9). Electronically Signed   By: Zetta Bills M.D.   On: 10/18/2021 14:51   DG Chest Portable 1 View  Result Date: 10/18/2021 CLINICAL DATA:  Fall, tachycardia EXAM: PORTABLE CHEST 1 VIEW COMPARISON:  Chest x-ray 01/09/2010 FINDINGS: Heart size and mediastinal contours are within normal limits. Stable likely calcified granuloma in the left upper lobe. Mildly prominent chronic interstitial lung markings. No suspicious focal opacities identified. No pleural effusion or pneumothorax visualized. No acute osseous abnormality appreciated. Old left-sided rib fractures noted. IMPRESSION: Chronic findings with no acute intrathoracic process identified. Electronically Signed   By: Ofilia Neas M.D.   On: 10/18/2021 13:36   ECHOCARDIOGRAM COMPLETE  Result Date: 10/19/2021    ECHOCARDIOGRAM REPORT   Patient Name:   HERMIE HOK Thuman Date of Exam: 10/19/2021 Medical Rec #:  [id number]      Height:       64.0 in Accession #:    [id number]     Weight:       130.0 lb Date of Birth:  24-Mar-1934      BSA:          1.629 m Patient Age:    62 years       BP:           112/72 mmHg Patient Gender: F              HR:           99 bpm. Exam Location:  Inpatient Procedure: 2D Echo, Cardiac Doppler and Color Doppler Indications:    I48.91 AFIB  History:        Patient has no prior history of Echocardiogram examinations.                 Risk Factors:Hypertension.  Sonographer:    Beryle Beams Referring Phys: [id number] [place]  1. Left ventricular ejection fraction, by estimation, is 60 to 65%. The left ventricle has normal function. The left ventricle has no regional wall motion abnormalities. Left ventricular diastolic parameters are indeterminate.  2. Right  ventricular systolic function is normal. The right ventricular size is normal. Tricuspid regurgitation signal is inadequate for assessing PA pressure.  3. The mitral valve is normal in structure. Trivial mitral valve regurgitation. No evidence of mitral stenosis.  4. The aortic valve is tricuspid. Aortic valve regurgitation is mild. Aortic valve sclerosis/calcification is present, without any evidence of aortic stenosis.  5. The patient is in atrial fibrillation. FINDINGS  Left Ventricle: Left ventricular ejection fraction, by estimation, is 60 to 65%. The left ventricle has normal function. The left ventricle has no regional wall motion abnormalities. The left ventricular internal cavity size was normal in size. There is  no left ventricular hypertrophy. Left ventricular diastolic parameters are indeterminate. Right Ventricle: The right ventricular size is normal. No increase in right ventricular wall thickness. Right ventricular systolic function is normal. Tricuspid regurgitation signal is inadequate for assessing PA pressure. Left Atrium: Left atrial size was normal in size. Right Atrium: Right atrial size was normal in size. Pericardium: There is no evidence of pericardial effusion. Mitral Valve: The mitral valve is normal in structure. There is mild calcification of the mitral valve leaflet(s).  Trivial mitral valve regurgitation. No evidence of mitral valve stenosis. Tricuspid Valve: The tricuspid valve is normal in structure. Tricuspid valve regurgitation is trivial. Aortic Valve: The aortic valve is tricuspid. Aortic valve regurgitation is mild. Aortic regurgitation PHT measures 493 msec. Aortic valve sclerosis/calcification is present, without any evidence of aortic stenosis. Aortic valve mean gradient measures 5.0  mmHg. Aortic valve peak gradient measures 9.2 mmHg. Aortic valve area, by VTI measures 1.13 cm. Pulmonic Valve: The pulmonic valve was normal in structure. Pulmonic valve regurgitation is not  visualized. Aorta: The aortic root is normal in size and structure. Venous: The inferior vena cava was not well visualized. IAS/Shunts: No atrial level shunt detected by color flow Doppler.  LEFT VENTRICLE PLAX 2D LVIDd:         3.60 cm LVIDs:         1.90 cm LV PW:         1.00 cm LV IVS:        0.80 cm LVOT diam:     1.70 cm LV SV:         33 LV SV Index:   20 LVOT Area:     2.27 cm  LV Volumes (MOD) LV vol d, MOD A2C: 35.9 ml LV vol d, MOD A4C: 42.9 ml LV vol s, MOD A2C: 14.1 ml LV vol s, MOD A4C: 14.6 ml LV SV MOD A2C:     21.8 ml LV SV MOD A4C:     42.9 ml LV SV MOD BP:      26.6 ml RIGHT VENTRICLE RV S prime:     14.50 cm/s RVOT diam:      1.90 cm TAPSE (M-mode): 1.6 cm LEFT ATRIUM             Index        RIGHT ATRIUM          Index LA diam:        3.80 cm 2.33 cm/m   RA Area:     7.29 cm LA Vol (A2C):   43.7 ml 26.83 ml/m  RA Volume:   11.70 ml 7.18 ml/m LA Vol (A4C):   25.3 ml 15.53 ml/m LA Biplane Vol: 33.5 ml 20.56 ml/m  AORTIC VALVE                     PULMONIC VALVE AV Area (Vmax):    1.31 cm      PV Vmax:       0.59 m/s AV Area (Vmean):   1.19 cm      PV Vmean:      34.900 cm/s AV Area (VTI):     1.13 cm      PV VTI:        0.099 m AV Vmax:           152.00 cm/s   PV Peak grad:  1.4 mmHg AV Vmean:          101.000 cm/s  PV Mean grad:  1.0 mmHg AV VTI:            0.289 m AV Peak Grad:      9.2 mmHg AV Mean Grad:      5.0 mmHg LVOT Vmax:         87.70 cm/s LVOT Vmean:        52.900 cm/s LVOT VTI:          0.144 m LVOT/AV VTI ratio: 0.50 AI PHT:  493 msec  AORTA Ao Root diam: 3.00 cm Ao Asc diam:  3.50 cm TRICUSPID VALVE TV Peak grad:   20.0 mmHg TV Mean grad:   15.0 mmHg TV Vmax:        2.24 m/s TV Vmean:       190.0 cm/s TV VTI:         0.54 msec  SHUNTS Systemic VTI:  0.14 m Systemic Diam: 1.70 cm Pulmonic Diam: 1.90 cm Dalton McleanMD Electronically signed by Franki Monte Signature Date/Time: 10/19/2021/1:57:20 PM    Final    US Abdomen Limited RUQ (LIVER/GB)  Result Date:  10/18/2021 CLINICAL DATA:  LFT elevation EXAM: ULTRASOUND ABDOMEN LIMITED RIGHT UPPER QUADRANT COMPARISON:  None. FINDINGS: Gallbladder: Extensive cholelithiasis with wall echo shadow sign. Negative sonographic Murphy sign. Minimal wall thickening Common bile duct: Diameter: 9 mm.  No intraductal lesion identified Liver: No focal lesion identified. Within normal limits in parenchymal echogenicity. Portal vein is patent on color Doppler imaging with normal direction of blood flow towards the liver. Other: None. IMPRESSION: Cholelithiasis.  No evidence of acute cholecystitis. Prominent common bile duct measuring 9.2 mm. No intrahepatic ductal dilation. Correlate with LFTs. Electronically Signed   By: Maurine Simmering M.D.   On: 10/18/2021 15:07      LOS: 1 day    Cordelia Poche, MD [facility] 10/19/2021, 3:58 PM   If 7PM-7AM, please contact night-coverage [email or web address]

## 2021-10-19 NOTE — Assessment & Plan Note (Addendum)
Well controlled. Continue home atenolol and hydrochlorothiazide.

## 2021-10-19 NOTE — ED Notes (Signed)
Patient's heart rate noted to be in the 40s, cardizem drip paused, current HR 98.  MD secure chatted.

## 2021-10-20 ENCOUNTER — Encounter (HOSPITAL_COMMUNITY): Payer: Self-pay | Admitting: Internal Medicine

## 2021-10-20 DIAGNOSIS — L8990 Pressure ulcer of unspecified site, unspecified stage: Secondary | ICD-10-CM | POA: Diagnosis present

## 2021-10-20 LAB — CBC
HCT: 36.1 % (ref 36.0–46.0)
Hemoglobin: 11.5 g/dL — ABNORMAL LOW (ref 12.0–15.0)
MCH: 28.3 pg (ref 26.0–34.0)
MCHC: 31.9 g/dL (ref 30.0–36.0)
MCV: 88.9 fL (ref 80.0–100.0)
Platelets: 196 10*3/uL (ref 150–400)
RBC: 4.06 MIL/uL (ref 3.87–5.11)
RDW: 15.9 % — ABNORMAL HIGH (ref 11.5–15.5)
WBC: 10.1 10*3/uL (ref 4.0–10.5)
nRBC: 0 % (ref 0.0–0.2)

## 2021-10-20 LAB — BASIC METABOLIC PANEL
Anion gap: 5 (ref 5–15)
BUN: 21 mg/dL (ref 8–23)
CO2: 27 mmol/L (ref 22–32)
Calcium: 7.8 mg/dL — ABNORMAL LOW (ref 8.9–10.3)
Chloride: 108 mmol/L (ref 98–111)
Creatinine, Ser: 0.34 mg/dL — ABNORMAL LOW (ref 0.44–1.00)
GFR, Estimated: >60 mL/min (ref >60)
Glucose, Bld: 82 mg/dL (ref 70–99)
Potassium: 3.1 mmol/L — ABNORMAL LOW (ref 3.5–5.1)
Sodium: 140 mmol/L (ref 135–145)

## 2021-10-20 LAB — HEPATIC FUNCTION PANEL
ALT: 90 U/L — ABNORMAL HIGH (ref 0–44)
AST: 152 U/L — ABNORMAL HIGH (ref 15–41)
Albumin: 3.1 g/dL — ABNORMAL LOW (ref 3.5–5.0)
Alkaline Phosphatase: 39 U/L (ref 38–126)
Bilirubin, Direct: 0.1 mg/dL (ref 0.0–0.2)
Indirect Bilirubin: 0.4 mg/dL (ref 0.3–0.9)
Total Bilirubin: 0.5 mg/dL (ref 0.3–1.2)
Total Protein: 5.6 g/dL — ABNORMAL LOW (ref 6.5–8.1)

## 2021-10-20 LAB — HEPARIN LEVEL (UNFRACTIONATED): Heparin Unfractionated: 0.21 IU/mL — ABNORMAL LOW (ref 0.30–0.70)

## 2021-10-20 LAB — CK: Total CK: 2472 U/L — ABNORMAL HIGH (ref 38–234)

## 2021-10-20 MED ORDER — POTASSIUM CHLORIDE CRYS ER 20 MEQ PO TBCR
40.0000 meq | EXTENDED_RELEASE_TABLET | ORAL | Status: AC
  Administered 2021-10-20 (×2): 40 meq via ORAL
  Filled 2021-10-20 (×2): qty 2

## 2021-10-20 MED ORDER — APIXABAN 2.5 MG PO TABS
2.5000 mg | ORAL_TABLET | Freq: Two times a day (BID) | ORAL | Status: DC
  Administered 2021-10-20 – 2021-10-22 (×5): 2.5 mg via ORAL
  Filled 2021-10-20 (×5): qty 1

## 2021-10-20 NOTE — Assessment & Plan Note (Signed)
Medial coccyx.

## 2021-10-20 NOTE — Progress Notes (Signed)
PROGRESS NOTE    ENVY LUST  [id number] DOB: [birth year] DOA: 10/18/2021 PCP: [name], MD   Brief Narrative: Maria [name] is a 88 y.o. female with a history of hypertension, hypothyroidism, anxiety. Patient presented after falling down while in the dark and was subsequently unable to get up. She was found to have evidence of rhabdomyolysis and atrial fibrillation with RVR on admission. IV fluids and diltiazem drip initiated, along with heparin IV. Patient transitioned to home atenolol in addition to Eliquis. Continued IV fluids for rhabdomyolysis.   Assessment and Plan: * Traumatic rhabdomyolysis ([place])- (present on admission) In setting of fall and time down. CK of 9498 on admission with associated AST/ALT of 393/143 and bilirubin of 2.3. NS IV fluids initiated. CK of 5758 today. -Trend CMP and CK daily -Continue LR IV fluids -Strict in/out  Paroxysmal atrial fibrillation with RVR (HCC) New onset. Patient started on diltiazem IV and heparin IV on admission. Transthoracic Echocardiogram ordered and is without significant valvular disease or LV dysfunction. Patient is on atenolol as an outpatient and withdrawal may be precipitating factor. TSH normal. CHA2DS2-VASc Score is 4. -Transition to Eliquis -Continue atenolol -Discontinue diltiazem IV and heparin IV  Hypokalemia Normal magnesium. Potassium of 3.1 on BMP today. -Continue potassium supplementation with Kdur  Elevated troponin Mild elevated. No history concerning for ACS. In setting of atrial fibrillation with RVR. Transthoracic Echocardiogram ordered on admission and is without LV dysfunction or wall motion abnormalities.  Elevated LFTs Likely secondary to rhabdomyolysis. Improving with treatment of rhabdomyolysis.  Pressure injury of skin- (present on admission) Medial coccyx.  Generalized anxiety disorder Patient is on Zoloft as an outpatient. -Continue home Zoloft  Hypothyroidism -Continue  home Synthroid  Primary hypertension Patient is on atenolol and hydrochlorothiazide as an outpatient. Blood pressure stable.  SIRS (systemic inflammatory response syndrome) (HCC) No source. Possibly related to dehydration. Urine cultures obtained. No blood cultures obtained. No antibiotics initiated. WBC trending down. -Monitor CBC  Falls -PT/OT consult  Lactic acidosis-resolved as of 10/19/2021 Mild. Lactic acid of 2.2 on admission, down to 1.7 with IV fluids. Resolved.     DVT prophylaxis: Heparin IV Code Status:   Code Status: Full Code Family Communication: None at bedside Disposition Plan: Discharge to home vs SNF pending continued improvement of rhabdomyolysis and PT/OT recommendations. Anticipate discharge in 2-5 days   Consultants:  None  Procedures:  Transthoracic Echocardiogram   Antimicrobials: None    Subjective: No issues this morning.  Objective: BP 117/73 (BP Location: Left Arm)    Pulse 86    Temp 97.6 F (36.4 C) (Oral)    Resp 20    Ht 5\' 4"  (1.626 m)    Wt 59 kg    SpO2 98%    BMI 22.31 kg/m   Examination:  General exam: Appears calm and comfortable Respiratory system: Clear to auscultation. Respiratory effort normal. Cardiovascular system: S1 & S2 heard, irregular rhythm with normal rate. No murmurs, rubs, gallops or clicks. Gastrointestinal system: Abdomen is nondistended, soft and nontender. No organomegaly or masses felt. Normal bowel sounds heard. Central nervous system: Alert and oriented. No focal neurological deficits. Musculoskeletal: No edema. No calf tenderness Skin: No cyanosis. Psychiatry: Judgement and insight appear normal. Mood & affect appropriate.    Data Reviewed: I have personally reviewed following labs and imaging studies  CBC Lab Results  Component Value Date   WBC 10.1 10/20/2021   RBC 4.06 10/20/2021   HGB 11.5 (L) 10/20/2021   HCT 36.1 10/20/2021  MCV 88.9 10/20/2021   MCH 28.3 10/20/2021   PLT 196  10/20/2021   MCHC 31.9 10/20/2021   RDW 15.9 (H) 10/20/2021   LYMPHSABS 1.0 10/18/2021   MONOABS 2.7 (H) 10/18/2021   EOSABS 0.0 10/18/2021   BASOSABS 0.0 0000000     Last metabolic panel Lab Results  Component Value Date   NA 140 10/20/2021   K 3.1 (L) 10/20/2021   CL 108 10/20/2021   CO2 27 10/20/2021   BUN 21 10/20/2021   CREATININE 0.34 (L) 10/20/2021   GLUCOSE 82 10/20/2021   GFRNONAA >60 10/20/2021   CALCIUM 7.8 (L) 10/20/2021   PROT 5.6 (L) 10/20/2021   ALBUMIN 3.1 (L) 10/20/2021   BILITOT 0.5 10/20/2021   ALKPHOS 39 10/20/2021   AST 152 (H) 10/20/2021   ALT 90 (H) 10/20/2021   ANIONGAP 5 10/20/2021    GFR: Estimated Creatinine Clearance: 44.4 mL/min (A) (by C-G formula based on SCr of 0.34 mg/dL (L)).  Recent Results (from the past 240 hour(s))  Resp Panel by RT-PCR (Flu A&B, Covid) Nasopharyngeal Swab     Status: None   Collection Time: 10/18/21  1:12 PM   Specimen: Nasopharyngeal Swab; Nasopharyngeal(NP) swabs in vial transport medium  Result Value Ref Range Status   SARS Coronavirus 2 by RT PCR NEGATIVE NEGATIVE Final    Comment: (NOTE) SARS-CoV-2 target nucleic acids are NOT DETECTED.  The SARS-CoV-2 RNA is generally detectable in upper respiratory specimens during the acute phase of infection. The lowest concentration of SARS-CoV-2 viral copies this assay can detect is 138 copies/mL. A negative result does not preclude SARS-Cov-2 infection and should not be used as the sole basis for treatment or other patient management decisions. A negative result may occur with  improper specimen collection/handling, submission of specimen other than nasopharyngeal swab, presence of viral mutation(s) within the areas targeted by this assay, and inadequate number of viral copies(<138 copies/mL). A negative result must be combined with clinical observations, patient history, and epidemiological information. The expected result is Negative.  Fact Sheet for  Patients:  EntrepreneurPulse.com.au  Fact Sheet for Healthcare Providers:  IncredibleEmployment.be  This test is no t yet approved or cleared by the [place] FDA and  has been authorized for detection and/or diagnosis of SARS-CoV-2 by FDA under an Emergency Use Authorization (EUA). This EUA will remain  in effect (meaning this test can be used) for the duration of the COVID-19 declaration under Section 564(b)(1) of the Act, 21 U.S.C.section 360bbb-3(b)(1), unless the authorization is terminated  or revoked sooner.       Influenza A by PCR NEGATIVE NEGATIVE Final   Influenza B by PCR NEGATIVE NEGATIVE Final    Comment: (NOTE) The Xpert Xpress SARS-CoV-2/FLU/RSV plus assay is intended as an aid in the diagnosis of influenza from Nasopharyngeal swab specimens and should not be used as a sole basis for treatment. Nasal washings and aspirates are unacceptable for Xpert Xpress SARS-CoV-2/FLU/RSV testing.  Fact Sheet for Patients: EntrepreneurPulse.com.au  Fact Sheet for Healthcare Providers: IncredibleEmployment.be  This test is not yet approved or cleared by the [place] FDA and has been authorized for detection and/or diagnosis of SARS-CoV-2 by FDA under an Emergency Use Authorization (EUA). This EUA will remain in effect (meaning this test can be used) for the duration of the COVID-19 declaration under Section 564(b)(1) of the Act, 21 U.S.C. section 360bbb-3(b)(1), unless the authorization is terminated or revoked.  Performed at [facility], [place] [address], [place], [place] [address]  Urine Culture     Status: None (Preliminary result)   Collection Time: 10/18/21  1:37 PM   Specimen: Urine, Clean Catch  Result Value Ref Range Status   Specimen Description   Final    URINE, CLEAN CATCH Performed at [facility], [place] [address] [place], [place]  [address]    Special Requests   Final    NONE Performed at [facility], [place] [address] [place], [place] [address]    Culture   Final    CULTURE REINCUBATED FOR BETTER GROWTH Performed at [facility], [place] [address] [place], [place] [address]    Report Status PENDING  Incomplete      Radiology Studies: DG Shoulder Right  Result Date: 10/18/2021 CLINICAL DATA:  Fall, right shoulder pain and bruising EXAM: RIGHT SHOULDER - 2+ VIEW COMPARISON:  Shoulder radiographs obtained earlier the same day FINDINGS: There is marked degenerative change at the glenohumeral joint with a high-riding humeral head suggesting rotator cuff pathology. There are moderate degenerative changes of the [facility] joint. The previously described lucent lesion is favored to reflect artifact. There is no acute fracture or dislocation. IMPRESSION: 1. The previously described lucent lesion is favored to reflect artifact. No suspicious lesions are seen. 2. Marked degenerative changes about the shoulder as above. No acute fracture or dislocation. Electronically Signed   By: Valetta Mole M.D.   On: 10/18/2021 14:46   DG Shoulder Right  Result Date: 10/18/2021 CLINICAL DATA:  Found down, tachycardia EXAM: RIGHT SHOULDER - 2+ VIEW COMPARISON:  None FINDINGS: Osseous demineralization. Degenerative changes at [facility] joint with significant inferior acromial spur formation. Glenohumeral joint space narrowing and spur formation. Questionable lucent lesion within the proximal RIGHT humerus at the head/metaphyseal junction versus artifact from surrounding spurs. No acute fracture or dislocation. Remodeling of the undersurface of the acromion likely reflecting chronic rotator cuff tear. No acute rib abnormalities. IMPRESSION: Degenerative changes of the RIGHT acromioclavicular and glenohumeral joints knee without acute fracture or dislocation. Questionable lucent lesion at the head/metaphyseal junction of the proximal RIGHT  humerus versus artifact related to surrounding spur formation; consider internal and external rotation views of RIGHT shoulder when patient is clinically able. Electronically Signed   By: Lavonia Dana M.D.   On: 10/18/2021 13:57   CT HEAD WO CONTRAST (5MM)  Result Date: 10/18/2021 CLINICAL DATA:  A female at age 79 presents for evaluation of neck trauma, multiple falls over the past 2 days EXAM: CT HEAD WITHOUT CONTRAST CT CERVICAL SPINE WITHOUT CONTRAST TECHNIQUE: Multidetector CT imaging of the head and cervical spine was performed following the standard protocol without intravenous contrast. Multiplanar CT image reconstructions of the cervical spine were also generated. RADIATION DOSE REDUCTION: This exam was performed according to the departmental dose-optimization program which includes automated exposure control, adjustment of the mA and/or kV according to patient size and/or use of iterative reconstruction technique. COMPARISON:  None. None FINDINGS: CT HEAD FINDINGS Brain: No evidence of acute infarction, hemorrhage, hydrocephalus, extra-axial collection or mass lesion/mass effect. Signs of chronic microvascular ischemic change and generalized atrophy. Vascular: No hyperdense vessel or unexpected calcification. Skull: Normal. Negative for fracture or focal lesion. Sinuses/Orbits: Complete opacification of the RIGHT maxillary sinus with bony thickening compatible with chronic sinusitis. No air-fluid levels in the remaining sinuses with minimal scattered ethmoid opacification. Other: None CT CERVICAL SPINE FINDINGS Alignment: Areas of variable antra retrolisthesis very mild in the cervical spine associated with marked degenerative changes. 1-2 mm anterolisthesis of  C3 on C4. 3 mm retrolisthesis of C4 on C5. 3 mm anterolisthesis of C6 on C7. Preservation and mild accentuation of normal cervical lordotic curvature. 3 mm anterolisthesis of T1 on T2 is noted also with surrounding degenerative changes and  approximately 2 mm anterolisthesis of C2 on T3. Skull base and vertebrae: No acute fracture. No primary bone lesion or focal pathologic process. Soft tissues and spinal canal: No prevertebral fluid or swelling. No visible canal hematoma. Disc levels: Multilevel disc space narrowing and facet arthropathy associated with areas of antro and retrolisthesis as discussed above. Degenerative changes are marked and worse in the mid and lower cervical spine. Upper chest: Pulmonary emphysema, moderate at the lung apices. Other: None IMPRESSION: 1. No acute intracranial abnormality. 2. Signs of chronic microvascular ischemic change and generalized atrophy. 3. No evidence for acute traumatic injury to the cervical spine. 4. Multilevel degenerative changes as discussed with associated changes and alignment likely related to profound disc space narrowing and multilevel facet arthropathy. Not associated with prevertebral soft tissue swelling. 5. Pulmonary emphysema, moderate at the lung apices. 6. Chronic RIGHT maxillary sinusitis complete opacification of the RIGHT maxillary sinus is noted. Correlate with any symptoms of sinus disease. Emphysema (ICD10-J43.9). Electronically Signed   By: Zetta Bills M.D.   On: 10/18/2021 14:51   CT Cervical Spine Wo Contrast  Result Date: 10/18/2021 CLINICAL DATA:  A female at age 51 presents for evaluation of neck trauma, multiple falls over the past 2 days EXAM: CT HEAD WITHOUT CONTRAST CT CERVICAL SPINE WITHOUT CONTRAST TECHNIQUE: Multidetector CT imaging of the head and cervical spine was performed following the standard protocol without intravenous contrast. Multiplanar CT image reconstructions of the cervical spine were also generated. RADIATION DOSE REDUCTION: This exam was performed according to the departmental dose-optimization program which includes automated exposure control, adjustment of the mA and/or kV according to patient size and/or use of iterative reconstruction  technique. COMPARISON:  None. None FINDINGS: CT HEAD FINDINGS Brain: No evidence of acute infarction, hemorrhage, hydrocephalus, extra-axial collection or mass lesion/mass effect. Signs of chronic microvascular ischemic change and generalized atrophy. Vascular: No hyperdense vessel or unexpected calcification. Skull: Normal. Negative for fracture or focal lesion. Sinuses/Orbits: Complete opacification of the RIGHT maxillary sinus with bony thickening compatible with chronic sinusitis. No air-fluid levels in the remaining sinuses with minimal scattered ethmoid opacification. Other: None CT CERVICAL SPINE FINDINGS Alignment: Areas of variable antra retrolisthesis very mild in the cervical spine associated with marked degenerative changes. 1-2 mm anterolisthesis of C3 on C4. 3 mm retrolisthesis of C4 on C5. 3 mm anterolisthesis of C6 on C7. Preservation and mild accentuation of normal cervical lordotic curvature. 3 mm anterolisthesis of T1 on T2 is noted also with surrounding degenerative changes and approximately 2 mm anterolisthesis of C2 on T3. Skull base and vertebrae: No acute fracture. No primary bone lesion or focal pathologic process. Soft tissues and spinal canal: No prevertebral fluid or swelling. No visible canal hematoma. Disc levels: Multilevel disc space narrowing and facet arthropathy associated with areas of antro and retrolisthesis as discussed above. Degenerative changes are marked and worse in the mid and lower cervical spine. Upper chest: Pulmonary emphysema, moderate at the lung apices. Other: None IMPRESSION: 1. No acute intracranial abnormality. 2. Signs of chronic microvascular ischemic change and generalized atrophy. 3. No evidence for acute traumatic injury to the cervical spine. 4. Multilevel degenerative changes as discussed with associated changes and alignment likely related to profound disc space narrowing and multilevel facet arthropathy.  Not associated with prevertebral soft tissue  swelling. 5. Pulmonary emphysema, moderate at the lung apices. 6. Chronic RIGHT maxillary sinusitis complete opacification of the RIGHT maxillary sinus is noted. Correlate with any symptoms of sinus disease. Emphysema (ICD10-J43.9). Electronically Signed   By: Zetta Bills M.D.   On: 10/18/2021 14:51   DG Chest Portable 1 View  Result Date: 10/18/2021 CLINICAL DATA:  Fall, tachycardia EXAM: PORTABLE CHEST 1 VIEW COMPARISON:  Chest x-ray 01/09/2010 FINDINGS: Heart size and mediastinal contours are within normal limits. Stable likely calcified granuloma in the left upper lobe. Mildly prominent chronic interstitial lung markings. No suspicious focal opacities identified. No pleural effusion or pneumothorax visualized. No acute osseous abnormality appreciated. Old left-sided rib fractures noted. IMPRESSION: Chronic findings with no acute intrathoracic process identified. Electronically Signed   By: Ofilia Neas M.D.   On: 10/18/2021 13:36   ECHOCARDIOGRAM COMPLETE  Result Date: 10/19/2021    ECHOCARDIOGRAM REPORT   Patient Name:   Maria [name] Date of Exam: 10/19/2021 Medical Rec #:  [id number]      Height:       64.0 in Accession #:    [id number]     Weight:       130.0 lb Date of Birth:  06-16-1934      BSA:          1.629 m Patient Age:    21 years       BP:           112/72 mmHg Patient Gender: F              HR:           99 bpm. Exam Location:  Inpatient Procedure: 2D Echo, Cardiac Doppler and Color Doppler Indications:    I48.91 AFIB  History:        Patient has no prior history of Echocardiogram examinations.                 Risk Factors:Hypertension.  Sonographer:    Beryle Beams Referring Phys: [id number] [place]  1. Left ventricular ejection fraction, by estimation, is 60 to 65%. The left ventricle has normal function. The left ventricle has no regional wall motion abnormalities. Left ventricular diastolic parameters are indeterminate.  2. Right ventricular systolic function is  normal. The right ventricular size is normal. Tricuspid regurgitation signal is inadequate for assessing PA pressure.  3. The mitral valve is normal in structure. Trivial mitral valve regurgitation. No evidence of mitral stenosis.  4. The aortic valve is tricuspid. Aortic valve regurgitation is mild. Aortic valve sclerosis/calcification is present, without any evidence of aortic stenosis.  5. The patient is in atrial fibrillation. FINDINGS  Left Ventricle: Left ventricular ejection fraction, by estimation, is 60 to 65%. The left ventricle has normal function. The left ventricle has no regional wall motion abnormalities. The left ventricular internal cavity size was normal in size. There is  no left ventricular hypertrophy. Left ventricular diastolic parameters are indeterminate. Right Ventricle: The right ventricular size is normal. No increase in right ventricular wall thickness. Right ventricular systolic function is normal. Tricuspid regurgitation signal is inadequate for assessing PA pressure. Left Atrium: Left atrial size was normal in size. Right Atrium: Right atrial size was normal in size. Pericardium: There is no evidence of pericardial effusion. Mitral Valve: The mitral valve is normal in structure. There is mild calcification of the mitral valve leaflet(s). Trivial mitral valve regurgitation. No evidence of mitral valve stenosis. Tricuspid Valve:  The tricuspid valve is normal in structure. Tricuspid valve regurgitation is trivial. Aortic Valve: The aortic valve is tricuspid. Aortic valve regurgitation is mild. Aortic regurgitation PHT measures 493 msec. Aortic valve sclerosis/calcification is present, without any evidence of aortic stenosis. Aortic valve mean gradient measures 5.0  mmHg. Aortic valve peak gradient measures 9.2 mmHg. Aortic valve area, by VTI measures 1.13 cm. Pulmonic Valve: The pulmonic valve was normal in structure. Pulmonic valve regurgitation is not visualized. Aorta: The aortic root  is normal in size and structure. Venous: The inferior vena cava was not well visualized. IAS/Shunts: No atrial level shunt detected by color flow Doppler.  LEFT VENTRICLE PLAX 2D LVIDd:         3.60 cm LVIDs:         1.90 cm LV PW:         1.00 cm LV IVS:        0.80 cm LVOT diam:     1.70 cm LV SV:         33 LV SV Index:   20 LVOT Area:     2.27 cm  LV Volumes (MOD) LV vol d, MOD A2C: 35.9 ml LV vol d, MOD A4C: 42.9 ml LV vol s, MOD A2C: 14.1 ml LV vol s, MOD A4C: 14.6 ml LV SV MOD A2C:     21.8 ml LV SV MOD A4C:     42.9 ml LV SV MOD BP:      26.6 ml RIGHT VENTRICLE RV S prime:     14.50 cm/s RVOT diam:      1.90 cm TAPSE (M-mode): 1.6 cm LEFT ATRIUM             Index        RIGHT ATRIUM          Index LA diam:        3.80 cm 2.33 cm/m   RA Area:     7.29 cm LA Vol (A2C):   43.7 ml 26.83 ml/m  RA Volume:   11.70 ml 7.18 ml/m LA Vol (A4C):   25.3 ml 15.53 ml/m LA Biplane Vol: 33.5 ml 20.56 ml/m  AORTIC VALVE                     PULMONIC VALVE AV Area (Vmax):    1.31 cm      PV Vmax:       0.59 m/s AV Area (Vmean):   1.19 cm      PV Vmean:      34.900 cm/s AV Area (VTI):     1.13 cm      PV VTI:        0.099 m AV Vmax:           152.00 cm/s   PV Peak grad:  1.4 mmHg AV Vmean:          101.000 cm/s  PV Mean grad:  1.0 mmHg AV VTI:            0.289 m AV Peak Grad:      9.2 mmHg AV Mean Grad:      5.0 mmHg LVOT Vmax:         87.70 cm/s LVOT Vmean:        52.900 cm/s LVOT VTI:          0.144 m LVOT/AV VTI ratio: 0.50 AI PHT:            493 msec  AORTA Ao Root  diam: 3.00 cm Ao Asc diam:  3.50 cm TRICUSPID VALVE TV Peak grad:   20.0 mmHg TV Mean grad:   15.0 mmHg TV Vmax:        2.24 m/s TV Vmean:       190.0 cm/s TV VTI:         0.54 msec  SHUNTS Systemic VTI:  0.14 m Systemic Diam: 1.70 cm Pulmonic Diam: 1.90 cm Dalton McleanMD Electronically signed by Franki Monte Signature Date/Time: 10/19/2021/1:57:20 PM    Final    US Abdomen Limited RUQ (LIVER/GB)  Result Date: 10/18/2021 CLINICAL DATA:  LFT  elevation EXAM: ULTRASOUND ABDOMEN LIMITED RIGHT UPPER QUADRANT COMPARISON:  None. FINDINGS: Gallbladder: Extensive cholelithiasis with wall echo shadow sign. Negative sonographic Murphy sign. Minimal wall thickening Common bile duct: Diameter: 9 mm.  No intraductal lesion identified Liver: No focal lesion identified. Within normal limits in parenchymal echogenicity. Portal vein is patent on color Doppler imaging with normal direction of blood flow towards the liver. Other: None. IMPRESSION: Cholelithiasis.  No evidence of acute cholecystitis. Prominent common bile duct measuring 9.2 mm. No intrahepatic ductal dilation. Correlate with LFTs. Electronically Signed   By: Maurine Simmering M.D.   On: 10/18/2021 15:07      LOS: 2 days    Cordelia Poche, MD [facility] 10/20/2021, 10:47 AM   If 7PM-7AM, please contact night-coverage [email or web address]

## 2021-10-20 NOTE — Progress Notes (Signed)
[place] for heparin Indication: atrial fibrillation  Allergies  Allergen Reactions   Clindamycin Shortness Of Breath, Rash and Other (See Comments)    Possible shortness of breath- definitely caused a congested nose   Diclofenac Sodium Rash and Other (See Comments)    Nasal congestion, also   Nsaids Rash and Other (See Comments)    Nasal congestion, also    Patient Measurements: Height: 5\' 4"  (162.6 cm) Weight: 59 kg (130 lb) IBW/kg (Calculated) : 54.7 Heparin Dosing Weight: 59 kg  Vital Signs: Temp: 97.4 F (36.3 C) (02/17 2317) Temp Source: Oral (02/17 2317) BP: 129/79 (02/17 2317) Pulse Rate: 75 (02/17 2317)  Labs: Recent Labs    10/18/21 1312 10/18/21 1312 10/18/21 1717 10/18/21 1830 10/18/21 1932 10/19/21 0500 10/19/21 1444 10/19/21 1652 10/20/21 0146  HGB 14.4  --   --   --   --  12.3  --   --  11.5*  HCT 44.9  --   --   --   --  38.0  --   --  36.1  PLT 240  --   --   --   --  197  --   --  196  APTT  --   --  27  --   --   --   --   --   --   LABPROT  --   --   --  14.6  --   --   --   --   --   INR  --   --   --  1.1  --   --   --   --   --   HEPARINUNFRC  --    < >  --   --   --  0.14* >1.10* 0.24* 0.21*  CREATININE 0.51  --   --   --   --  0.42*  --   --   --   CKTOTAL 9,498*  --   --   --   --  5,758*  --   --   --   TROPONINIHS 49*  --   --   --  52*  --   --   --   --    < > = values in this interval not displayed.     Estimated Creatinine Clearance: 44.4 mL/min (A) (by C-G formula based on SCr of 0.42 mg/dL (L)).   Medical History: Past Medical History:  Diagnosis Date   Hypertension    Thyroid disease     Medications:  Scheduled:   atenolol  75 mg Oral Daily   levothyroxine  88 mcg Oral Q0600   sertraline  50 mg Oral Daily    Assessment: 88 yo female presenting via EMS for fatigue and fall.  Patient reports falling twice in the last 3  days.  CT head obtained and was negative for any acute  abnormality.  No history of Afib but was found to have Afib by EMS.  No anticoagulation noted PTA.  CHADS-VASc = 3.  Pharmacy is consulted to dose heparin drip for new-onset Afib.   10/20/21 Heparin level  = 0.21 (subtherapeutic) which is slightly lower than previous level despite heparin rate increase to 1050 units/hr  Hgb = 11.5, Pltc - wnl No complications of therapy noted  Goal of Therapy:  Heparin level 0.3-0.7 units/ml Monitor platelets by anticoagulation protocol: Yes   Plan:  Increase heparin to 1150 units/hr Check HL  in 8 hours after heparin drip increase Daily CBC and HL while on heparin F/u ability to transition to PO anticoagulant  Leone Haven, PharmD 10/20/2021 3:55 AM

## 2021-10-20 NOTE — Plan of Care (Signed)
Pt continues on cardizem and heparin gtts. Pt in a.fib controlled in 80's-90's. Pt stand and piviot to [facility]. Large BM overnight. Pt VSS. Denies any further needs at this time.

## 2021-10-20 NOTE — Progress Notes (Signed)
ANTICOAGULATION CONSULT NOTE   Pharmacy Consult for heparin >> apixaban Indication: atrial fibrillation  Allergies  Allergen Reactions   Clindamycin Shortness Of Breath, Rash and Other (See Comments)    Possible shortness of breath- definitely caused a congested nose   Diclofenac Sodium Rash and Other (See Comments)    Nasal congestion, also   Nsaids Rash and Other (See Comments)    Nasal congestion, also    Patient Measurements: Height: 5\' 4"  (162.6 cm) Weight: 59 kg (130 lb) IBW/kg (Calculated) : 54.7 Heparin Dosing Weight: 59 kg  Vital Signs: Temp: 97.6 F (36.4 C) (02/18 0944) Temp Source: Oral (02/18 0944) BP: 117/73 (02/18 0944) Pulse Rate: 86 (02/18 0944)  Labs: Recent Labs    10/18/21 1312 10/18/21 1312 10/18/21 1717 10/18/21 1830 10/18/21 1932 10/19/21 0500 10/19/21 1444 10/19/21 1652 10/20/21 0146 10/20/21 0800  HGB 14.4  --   --   --   --  12.3  --   --  11.5*  --   HCT 44.9  --   --   --   --  38.0  --   --  36.1  --   PLT 240  --   --   --   --  197  --   --  196  --   APTT  --   --  27  --   --   --   --   --   --   --   LABPROT  --   --   --  14.6  --   --   --   --   --   --   INR  --   --   --  1.1  --   --   --   --   --   --   HEPARINUNFRC  --    < >  --   --   --  0.14* >1.10* 0.24* 0.21*  --   CREATININE 0.51  --   --   --   --  0.42*  --   --  0.34*  --   CKTOTAL 9,498*  --   --   --   --  5,758*  --   --   --  2,472*  TROPONINIHS 49*  --   --   --  52*  --   --   --   --   --    < > = values in this interval not displayed.     Estimated Creatinine Clearance: 44.4 mL/min (A) (by C-G formula based on SCr of 0.34 mg/dL (L)).   Medical History: Past Medical History:  Diagnosis Date   Hypertension    Thyroid disease     Medications:  Scheduled:   apixaban  2.5 mg Oral BID   atenolol  75 mg Oral Daily   levothyroxine  88 mcg Oral Q0600   potassium chloride  40 mEq Oral Q4H   sertraline  50 mg Oral Daily    Assessment: 88 yo  female presenting via EMS for fatigue and fall.  Patient reports falling twice in the last 3  days.  CT head obtained and was negative for any acute abnormality.  No history of Afib but was found to have Afib by EMS.  No anticoagulation noted PTA.  CHADS-VASc = 3.  Pharmacy was initially consulted to dose heparin drip, is now consulted to transition to apixaban.    10/20/21 CBC: Hgb slightly low, Plt  WNL Age: 50, TBW: 59 kg, SCr 0.34  Goal of Therapy:  Heparin level 0.3-0.7 units/ml Monitor platelets by anticoagulation protocol: Yes   Plan:  Stop heparin drip at the time that apixaban is given Apixaban 2.5 mg PO BID Patient will need manufacturer coupon and medication counseling prior to discharge  Cindi Carbon, PharmD 10/20/21 10:08 AM

## 2021-10-20 NOTE — Plan of Care (Signed)
  Problem: Education: Goal: Knowledge of disease or condition will improve Outcome: Progressing   Problem: Cardiac: Goal: Ability to achieve and maintain adequate cardiopulmonary perfusion will improve Outcome: Progressing   

## 2021-10-20 NOTE — Discharge Instructions (Addendum)
[place],  You were in the hospital because of being found down. You had some muscle damage and were found to have atrial fibrillation. Your muscle damage has improved. You were started on Eliquis but have requested to not be on blood thinners and have accepted the increased risk of stroke. Please continue to stay well hydrated. Please follow-up with your primary care physician.

## 2021-10-21 ENCOUNTER — Encounter (HOSPITAL_COMMUNITY): Payer: Self-pay | Admitting: Internal Medicine

## 2021-10-21 DIAGNOSIS — I4891 Unspecified atrial fibrillation: Secondary | ICD-10-CM

## 2021-10-21 LAB — URINE CULTURE

## 2021-10-21 LAB — BASIC METABOLIC PANEL
Anion gap: 6 (ref 5–15)
BUN: 12 mg/dL (ref 8–23)
CO2: 27 mmol/L (ref 22–32)
Calcium: 7.9 mg/dL — ABNORMAL LOW (ref 8.9–10.3)
Chloride: 103 mmol/L (ref 98–111)
Creatinine, Ser: 0.44 mg/dL (ref 0.44–1.00)
GFR, Estimated: >60 mL/min (ref >60)
Glucose, Bld: 83 mg/dL (ref 70–99)
Potassium: 4 mmol/L (ref 3.5–5.1)
Sodium: 136 mmol/L (ref 135–145)

## 2021-10-21 LAB — HEPATIC FUNCTION PANEL
ALT: 90 U/L — ABNORMAL HIGH (ref 0–44)
AST: 125 U/L — ABNORMAL HIGH (ref 15–41)
Albumin: 3.2 g/dL — ABNORMAL LOW (ref 3.5–5.0)
Alkaline Phosphatase: 43 U/L (ref 38–126)
Bilirubin, Direct: 0.2 mg/dL (ref 0.0–0.2)
Indirect Bilirubin: 0.9 mg/dL (ref 0.3–0.9)
Total Bilirubin: 1.1 mg/dL (ref 0.3–1.2)
Total Protein: 5.7 g/dL — ABNORMAL LOW (ref 6.5–8.1)

## 2021-10-21 LAB — CK: Total CK: 1995 U/L — ABNORMAL HIGH (ref 38–234)

## 2021-10-21 NOTE — Progress Notes (Signed)
Progress Note   Patient: Maria [name] [id number] DOB: Mar 07, 1934 DOA: 10/18/2021     3 DOS: the patient was seen and examined on 10/21/2021   Brief hospital course: Maria [name] is a 88 y.o. female with a history of hypertension, hypothyroidism, anxiety. Patient presented after falling down while in the dark and was subsequently unable to get up. She was found to have evidence of rhabdomyolysis and atrial fibrillation with RVR on admission. IV fluids and diltiazem drip initiated, along with heparin IV. Patient transitioned to home atenolol in addition to Eliquis. Continued IV fluids for rhabdomyolysis.  Assessment and Plan: * Traumatic rhabdomyolysis ([place])- (present on admission) In setting of fall and time down. CK of 9498 on admission with associated AST/ALT of 393/143 and bilirubin of 2.3. NS IV fluids initiated. CK levels have improved to 1995 today.  -Trend CMP and CK daily -Continue LR IV fluids -Strict in/out  Pressure injury of skin- (present on admission) Medial coccyx.  Generalized anxiety disorder Patient is on Zoloft as an outpatient. -Continue home Zoloft  Hypothyroidism -Continue home Synthroid. TSH wnl.   Primary hypertension Well controlled.  Patient is on atenolol and hydrochlorothiazide as an outpatient. Blood pressure stable.  Hypokalemia Replaced and repeat levels in am today showed improvement.   SIRS (systemic inflammatory response syndrome) (HCC) No source. Possibly related to dehydration. Urine cultures obtained. No blood cultures obtained. No antibiotics initiated. WBC trending down. -Monitor CBC  Elevated troponin Mild elevated. No history concerning for ACS. In setting of atrial fibrillation with RVR. Transthoracic Echocardiogram ordered on admission and is without LV dysfunction or wall motion abnormalities.  Falls -PT/OT consult Therapy eval recommending SNF.   Elevated LFTs Likely secondary to rhabdomyolysis. Improving with treatment of  rhabdomyolysis. Pt denies any nausea, vomiting or abdominal pain.   Paroxysmal atrial fibrillation with RVR ([place]) New onset. Patient started on diltiazem IV and heparin IV on admission. Transthoracic Echocardiogram ordered and is without significant valvular disease or LV dysfunction. Patient is on atenolol as an outpatient and withdrawal may be precipitating factor. TSH normal. CHA2DS2-VASc Score is 4. -Transition to Eliquis, continue the same. , -Continue atenolol and rate controlled.  -Discontinue diltiazem IV and heparin IV  Lactic acidosis-resolved as of 10/19/2021 Mild. Lactic acid of 2.2 on admission, down to 1.7 with IV fluids. Resolved.        Subjective: no new complaints.   Physical Exam: Vitals:   10/21/21 0500 10/21/21 0517 10/21/21 0913 10/21/21 1359  BP:  135/77 127/84 137/78  Pulse:  97 (!) 109 94  Resp:  18  18  Temp:  (!) 97.5 F (36.4 C)  98.3 F (36.8 C)  TempSrc:  Oral  Oral  SpO2:  97% 98% 98%  Weight: 68.2 kg     Height:       General exam: Appears calm and comfortable  Respiratory system: Clear to auscultation. Respiratory effort normal. Cardiovascular system: S1 & S2 heard, irregularly irregular. . No pedal edema. Gastrointestinal system: Abdomen is nondistended, soft and nontender. . Normal bowel sounds heard. Central nervous system: Alert and oriented. No focal neurological deficits. Extremities: Symmetric 5 x 5 power. Skin: No rashes, lesions or ulcers Psychiatry:  Mood & affect appropriate.    Data Reviewed: Results for orders placed or performed during the hospital encounter of 10/18/21 (from the past 24 hour(s))  CK     Status: Abnormal   Collection Time: 10/21/21  5:07 AM  Result Value Ref Range   Total CK 1,995 (H)  38 - 234 U/L  Hepatic function panel     Status: Abnormal   Collection Time: 10/21/21  5:07 AM  Result Value Ref Range   Total Protein 5.7 (L) 6.5 - 8.1 g/dL   Albumin 3.2 (L) 3.5 - 5.0 g/dL   AST 125 (H) 15 - 41 U/L    ALT 90 (H) 0 - 44 U/L   Alkaline Phosphatase 43 38 - 126 U/L   Total Bilirubin 1.1 0.3 - 1.2 mg/dL   Bilirubin, Direct 0.2 0.0 - 0.2 mg/dL   Indirect Bilirubin 0.9 0.3 - 0.9 mg/dL  Basic metabolic panel     Status: Abnormal   Collection Time: 10/21/21  5:07 AM  Result Value Ref Range   Sodium 136 135 - 145 mmol/L   Potassium 4.0 3.5 - 5.1 mmol/L   Chloride 103 98 - 111 mmol/L   CO2 27 22 - 32 mmol/L   Glucose, Bld 83 70 - 99 mg/dL   BUN 12 8 - 23 mg/dL   Creatinine, Ser 0.44 0.44 - 1.00 mg/dL   Calcium 7.9 (L) 8.9 - 10.3 mg/dL   GFR, Estimated >60 >60 mL/min   Anion gap 6 5 - 15     Family Communication: none at bedside.   Disposition: Status is: Inpatient Remains inpatient appropriate because: IV fluids , clinical improvement.           Planned Discharge Destination: Skilled nursing facility     Time spent: 38 minutes.    Author: Hosie Poisson, MD 10/21/2021 2:32 PM  For on call review [email or web address].

## 2021-10-21 NOTE — Progress Notes (Addendum)
Pt transferred to chair with moderate assist and performing active ROM exercises. Call bell and phone within reach. Mood is eager to be discharged soon to get back to home.

## 2021-10-21 NOTE — Plan of Care (Signed)
  Problem: Health Behavior/Discharge Planning: Goal: Ability to manage health-related needs will improve Outcome: Progressing   Problem: Clinical Measurements: Goal: Respiratory complications will improve Outcome: Progressing   Problem: Activity: Goal: Risk for activity intolerance will decrease Outcome: Progressing   

## 2021-10-22 DIAGNOSIS — R6510 Systemic inflammatory response syndrome (SIRS) of non-infectious origin without acute organ dysfunction: Secondary | ICD-10-CM

## 2021-10-22 DIAGNOSIS — I10 Essential (primary) hypertension: Secondary | ICD-10-CM

## 2021-10-22 DIAGNOSIS — E860 Dehydration: Secondary | ICD-10-CM

## 2021-10-22 LAB — COMPREHENSIVE METABOLIC PANEL
ALT: 81 U/L — ABNORMAL HIGH (ref 0–44)
AST: 90 U/L — ABNORMAL HIGH (ref 15–41)
Albumin: 3.1 g/dL — ABNORMAL LOW (ref 3.5–5.0)
Alkaline Phosphatase: 40 U/L (ref 38–126)
Anion gap: 6 (ref 5–15)
BUN: 10 mg/dL (ref 8–23)
CO2: 28 mmol/L (ref 22–32)
Calcium: 8.2 mg/dL — ABNORMAL LOW (ref 8.9–10.3)
Chloride: 101 mmol/L (ref 98–111)
Creatinine, Ser: 0.51 mg/dL (ref 0.44–1.00)
GFR, Estimated: >60 mL/min (ref >60)
Glucose, Bld: 94 mg/dL (ref 70–99)
Potassium: 3.6 mmol/L (ref 3.5–5.1)
Sodium: 135 mmol/L (ref 135–145)
Total Bilirubin: 0.7 mg/dL (ref 0.3–1.2)
Total Protein: 5.5 g/dL — ABNORMAL LOW (ref 6.5–8.1)

## 2021-10-22 LAB — CK: Total CK: 1033 U/L — ABNORMAL HIGH (ref 38–234)

## 2021-10-22 MED ORDER — ALUM & MAG HYDROXIDE-SIMETH 200-200-20 MG/5ML PO SUSP
15.0000 mL | ORAL | Status: DC | PRN
  Administered 2021-10-22: 15 mL via ORAL
  Filled 2021-10-22: qty 30

## 2021-10-22 NOTE — Plan of Care (Signed)
Reviewed DC paperwork with pt. PIVs removed. Pt's sister called to pick up pt. Pt treansported via WC to lobby   Problem: Education: Goal: Knowledge of disease or condition will improve Outcome: Completed/Met Goal: Understanding of medication regimen will improve Outcome: Completed/Met Goal: Individualized Educational Video(s) Outcome: Completed/Met   Problem: Activity: Goal: Ability to tolerate increased activity will improve Outcome: Completed/Met   Problem: Cardiac: Goal: Ability to achieve and maintain adequate cardiopulmonary perfusion will improve Outcome: Completed/Met   Problem: Health Behavior/Discharge Planning: Goal: Ability to safely manage health-related needs after discharge will improve Outcome: Completed/Met   Problem: Health Behavior/Discharge Planning: Goal: Ability to manage health-related needs will improve Outcome: Completed/Met   Problem: Clinical Measurements: Goal: Diagnostic test results will improve Outcome: Completed/Met Goal: Respiratory complications will improve Outcome: Completed/Met   Problem: Activity: Goal: Risk for activity intolerance will decrease Outcome: Completed/Met

## 2021-10-22 NOTE — Progress Notes (Signed)
Pt states eliquis gave her severe indigestion. Reports she will not take eliquis at home. MD notified of pt's statements. Pt educated by me that eliquis helps prevent clots, which in turn helps prevent strokes. Pt verbalizes understanding of eliquis benefits.

## 2021-10-22 NOTE — Progress Notes (Signed)
PT Cancellation Note  Patient Details Name: QUINTA EIMER MRN: [id number] DOB: 1934/06/06   Cancelled Treatment:    Reason Eval/Treat Not Completed:  Attempted PT tx session-pt declined to participate at this time.    Faye Ramsay, PT Acute Rehabilitation  Office: [phone] Pager: [phone]

## 2021-10-22 NOTE — Discharge Summary (Signed)
Physician Discharge Summary   Patient: Maria [name] MRN: [id number] DOB: 06-11-1934  Admit date:     10/18/2021  Discharge date: 10/22/21  Discharge Physician: Cordelia Poche, MD   PCP: [name], MD   Recommendations at discharge:   PCP follow-up Reconsider starting anticoagulation for stroke risk reduction in setting of known atrial fibrillation Consider cardiology referral if patient agrees  Discharge Diagnoses: Principal Problem:   Traumatic rhabdomyolysis [facility]) Active Problems:   Paroxysmal atrial fibrillation with RVR (HCC)   Elevated LFTs   Elevated troponin   SIRS (systemic inflammatory response syndrome) (Maria [name])   Primary hypertension   Hypothyroidism   Generalized anxiety disorder   Pressure injury of skin   Dehydration   Falls  Resolved Problems:   Hypokalemia   Lactic acidosis   [name] Course: Maria [name] is a 88 y.o. female with a history of hypertension, hypothyroidism, anxiety. Patient presented after falling down while in the dark and was subsequently unable to get up. She was found to have evidence of rhabdomyolysis and atrial fibrillation with RVR on admission. IV fluids and diltiazem drip initiated, along with heparin IV. Patient transitioned to home atenolol in addition to Eliquis. Continued IV fluids for rhabdomyolysis.  Assessment and Plan: * Traumatic rhabdomyolysis ([name])- (present on admission) In setting of fall and time down. CK of 9,498 on admission with associated AST/ALT of 393/143 and bilirubin of 2.3. NS IV fluids initiated. CK levels have improved to 1.033 and AST/ALT down to 90/81 prior to discharge  Paroxysmal atrial fibrillation with RVR (Maria [name]) New onset. Patient started on diltiazem IV and heparin IV on admission. Transthoracic Echocardiogram ordered and is without significant valvular disease or LV dysfunction. Patient is on atenolol as an outpatient and withdrawal may be precipitating factor. TSH normal. CHA2DS2-VASc Score  is 4. Patient was transitioned to atenolol and Eliquis but patient declined Eliquis on discharge; patient aware of stroke risk reduction with Eliquis use. Patient declined cardiology follow-up on discharge.  Elevated troponin Mild elevated. No history concerning for ACS. In setting of atrial fibrillation with RVR. Transthoracic Echocardiogram ordered on admission and is without LV dysfunction or wall motion abnormalities.  Elevated LFTs Likely secondary to rhabdomyolysis. Improving with treatment of rhabdomyolysis. Pt denies any nausea, vomiting or abdominal pain.   Hypokalemia-resolved as of 10/22/2021 Potassium as low as 2.7. Given potassium supplementation. Resolved.  Pressure injury of skin- (present on admission) Medial coccyx.  Generalized anxiety disorder Patient is on Zoloft as an outpatient. -Continue home Zoloft  Hypothyroidism Continue home Synthroid. TSH wnl.   Primary hypertension Well controlled. Continue home atenolol and hydrochlorothiazide.  SIRS (systemic inflammatory response syndrome) (HCC) No source. Possibly related to dehydration. Urine cultures obtained. No blood cultures obtained. No antibiotics initiated. WBC trending down.  Falls -PT/OT consult Therapy eval recommending SNF.   Lactic acidosis-resolved as of 10/19/2021 Mild. Lactic acid of 2.2 on admission, down to 1.7 with IV fluids. Resolved.         Consultants: None Procedures performed: TRANSTHORACIC ECHOCARDIOGRAM   Disposition: Home health Diet recommendation:  Cardiac diet  DISCHARGE MEDICATION: Allergies as of 10/22/2021       Reactions   Clindamycin Shortness Of Breath, Rash, Other (See Comments)   Possible shortness of breath- definitely caused a congested nose   Diclofenac Sodium Rash, Other (See Comments)   Nasal congestion, also   Nsaids Rash, Other (See Comments)   Nasal congestion, also        Medication List     STOP taking  these medications    ibuprofen 600 MG  tablet Commonly known as: ADVIL       TAKE these medications    acetaminophen 325 MG tablet Commonly known as: TYLENOL Take 650 mg by mouth in the morning and at bedtime.   alendronate 70 MG tablet Commonly known as: FOSAMAX Take 70 mg by mouth every Monday. Take with a full glass of water on an empty stomach.   atenolol 50 MG tablet Commonly known as: TENORMIN Take 75 mg by mouth daily.   hydrochlorothiazide 25 MG tablet Commonly known as: HYDRODIURIL Take 25 mg by mouth in the morning.   levothyroxine 88 MCG tablet Commonly known as: SYNTHROID Take 88 mcg by mouth daily before breakfast.   Oyster Shell 500 MG Tabs Take 500 mg by mouth daily.   potassium chloride SA 20 MEQ tablet Commonly known as: KLOR-CON M Take 20 mEq by mouth daily with breakfast.   sertraline 50 MG tablet Commonly known as: ZOLOFT Take 50 mg by mouth daily.               Durable Medical Equipment  (From admission, onward)           Start     Ordered   10/22/21 0821  For home use only DME Walker rolling  Once       Question Answer Comment  Walker: With 5 Inch Wheels   Patient needs a walker to treat with the following condition Traumatic rhabdomyolysis [name])   Patient needs a walker to treat with the following condition Atrial fibrillation with RVR (Maria [name])      10/22/21 0820            Follow-up Information     [facility] Follow up.   Contact information: tel#[phone] [address] Follow up.   Specialty: Rehabilitation Why: The office will call you to set up appt. Contact information: [address] [phone] Maria [name]        [name], MD. Schedule an appointment as soon as possible for a visit in 1 week(s).   Specialty: Internal Medicine Why: For [name] follow-up Contact information: [address] Maria [name]  [phone]                 Discharge Exam: Maria [name] Weights   10/18/21 1336 10/21/21 0500  Weight: 59 kg 68.2 kg   General exam: Appears calm and comfortable Respiratory system: Clear to auscultation. Respiratory effort normal. Cardiovascular system: S1 & S2 heard, irregular rhythm with normal rate Gastrointestinal system: Abdomen is nondistended, soft and nontender. No organomegaly or masses felt. Normal bowel sounds heard. Central nervous system: Alert and oriented. No focal neurological deficits. Musculoskeletal: No edema. No calf tenderness Skin: No cyanosis. No rashes Psychiatry: Judgement and insight appear normal. Mood & affect appropriate.   Condition at discharge: stable  The results of significant diagnostics from this hospitalization (including imaging, microbiology, ancillary and laboratory) are listed below for reference.   Imaging Studies: DG Shoulder Right  Result Date: 10/18/2021 CLINICAL DATA:  Fall, right shoulder pain and bruising EXAM: RIGHT SHOULDER - 2+ VIEW COMPARISON:  Shoulder radiographs obtained earlier the same day FINDINGS: There is marked degenerative change at the glenohumeral joint with a high-riding humeral head suggesting rotator cuff pathology. There are moderate degenerative changes of the [name] joint. The previously described lucent lesion is favored to reflect artifact. There  is no acute fracture or dislocation. IMPRESSION: 1. The previously described lucent lesion is favored to reflect artifact. No suspicious lesions are seen. 2. Marked degenerative changes about the shoulder as above. No acute fracture or dislocation. Electronically Signed   By: Valetta Mole M.D.   On: 10/18/2021 14:46   DG Shoulder Right  Result Date: 10/18/2021 CLINICAL DATA:  Found down, tachycardia EXAM: RIGHT SHOULDER - 2+ VIEW COMPARISON:  None FINDINGS: Osseous demineralization. Degenerative changes at [facility] joint with significant inferior acromial spur formation.  Glenohumeral joint space narrowing and spur formation. Questionable lucent lesion within the proximal RIGHT humerus at the head/metaphyseal junction versus artifact from surrounding spurs. No acute fracture or dislocation. Remodeling of the undersurface of the acromion likely reflecting chronic rotator cuff tear. No acute rib abnormalities. IMPRESSION: Degenerative changes of the RIGHT acromioclavicular and glenohumeral joints knee without acute fracture or dislocation. Questionable lucent lesion at the head/metaphyseal junction of the proximal RIGHT humerus versus artifact related to surrounding spur formation; consider internal and external rotation views of RIGHT shoulder when patient is clinically able. Electronically Signed   By: Lavonia Dana M.D.   On: 10/18/2021 13:57   CT HEAD WO CONTRAST (5MM)  Result Date: 10/18/2021 CLINICAL DATA:  A female at age 37 presents for evaluation of neck trauma, multiple falls over the past 2 days EXAM: CT HEAD WITHOUT CONTRAST CT CERVICAL SPINE WITHOUT CONTRAST TECHNIQUE: Multidetector CT imaging of the head and cervical spine was performed following the standard protocol without intravenous contrast. Multiplanar CT image reconstructions of the cervical spine were also generated. RADIATION DOSE REDUCTION: This exam was performed according to the departmental dose-optimization program which includes automated exposure control, adjustment of the mA and/or kV according to patient size and/or use of iterative reconstruction technique. COMPARISON:  None. None FINDINGS: CT HEAD FINDINGS Brain: No evidence of acute infarction, hemorrhage, hydrocephalus, extra-axial collection or mass lesion/mass effect. Signs of chronic microvascular ischemic change and generalized atrophy. Vascular: No hyperdense vessel or unexpected calcification. Skull: Normal. Negative for fracture or focal lesion. Sinuses/Orbits: Complete opacification of the RIGHT maxillary sinus with bony thickening  compatible with chronic sinusitis. No air-fluid levels in the remaining sinuses with minimal scattered ethmoid opacification. Other: None CT CERVICAL SPINE FINDINGS Alignment: Areas of variable antra retrolisthesis very mild in the cervical spine associated with marked degenerative changes. 1-2 mm anterolisthesis of C3 on C4. 3 mm retrolisthesis of C4 on C5. 3 mm anterolisthesis of C6 on C7. Preservation and mild accentuation of normal cervical lordotic curvature. 3 mm anterolisthesis of T1 on T2 is noted also with surrounding degenerative changes and approximately 2 mm anterolisthesis of C2 on T3. Skull base and vertebrae: No acute fracture. No primary bone lesion or focal pathologic process. Soft tissues and spinal canal: No prevertebral fluid or swelling. No visible canal hematoma. Disc levels: Multilevel disc space narrowing and facet arthropathy associated with areas of antro and retrolisthesis as discussed above. Degenerative changes are marked and worse in the mid and lower cervical spine. Upper chest: Pulmonary emphysema, moderate at the lung apices. Other: None IMPRESSION: 1. No acute intracranial abnormality. 2. Signs of chronic microvascular ischemic change and generalized atrophy. 3. No evidence for acute traumatic injury to the cervical spine. 4. Multilevel degenerative changes as discussed with associated changes and alignment likely related to profound disc space narrowing and multilevel facet arthropathy. Not associated with prevertebral soft tissue swelling. 5. Pulmonary emphysema, moderate at the lung apices. 6. Chronic RIGHT maxillary sinusitis complete opacification of the  RIGHT maxillary sinus is noted. Correlate with any symptoms of sinus disease. Emphysema (ICD10-J43.9). Electronically Signed   By: Zetta Bills M.D.   On: 10/18/2021 14:51   CT Cervical Spine Wo Contrast  Result Date: 10/18/2021 CLINICAL DATA:  A female at age 29 presents for evaluation of neck trauma, multiple falls over  the past 2 days EXAM: CT HEAD WITHOUT CONTRAST CT CERVICAL SPINE WITHOUT CONTRAST TECHNIQUE: Multidetector CT imaging of the head and cervical spine was performed following the standard protocol without intravenous contrast. Multiplanar CT image reconstructions of the cervical spine were also generated. RADIATION DOSE REDUCTION: This exam was performed according to the departmental dose-optimization program which includes automated exposure control, adjustment of the mA and/or kV according to patient size and/or use of iterative reconstruction technique. COMPARISON:  None. None FINDINGS: CT HEAD FINDINGS Brain: No evidence of acute infarction, hemorrhage, hydrocephalus, extra-axial collection or mass lesion/mass effect. Signs of chronic microvascular ischemic change and generalized atrophy. Vascular: No hyperdense vessel or unexpected calcification. Skull: Normal. Negative for fracture or focal lesion. Sinuses/Orbits: Complete opacification of the RIGHT maxillary sinus with bony thickening compatible with chronic sinusitis. No air-fluid levels in the remaining sinuses with minimal scattered ethmoid opacification. Other: None CT CERVICAL SPINE FINDINGS Alignment: Areas of variable antra retrolisthesis very mild in the cervical spine associated with marked degenerative changes. 1-2 mm anterolisthesis of C3 on C4. 3 mm retrolisthesis of C4 on C5. 3 mm anterolisthesis of C6 on C7. Preservation and mild accentuation of normal cervical lordotic curvature. 3 mm anterolisthesis of T1 on T2 is noted also with surrounding degenerative changes and approximately 2 mm anterolisthesis of C2 on T3. Skull base and vertebrae: No acute fracture. No primary bone lesion or focal pathologic process. Soft tissues and spinal canal: No prevertebral fluid or swelling. No visible canal hematoma. Disc levels: Multilevel disc space narrowing and facet arthropathy associated with areas of antro and retrolisthesis as discussed above.  Degenerative changes are marked and worse in the mid and lower cervical spine. Upper chest: Pulmonary emphysema, moderate at the lung apices. Other: None IMPRESSION: 1. No acute intracranial abnormality. 2. Signs of chronic microvascular ischemic change and generalized atrophy. 3. No evidence for acute traumatic injury to the cervical spine. 4. Multilevel degenerative changes as discussed with associated changes and alignment likely related to profound disc space narrowing and multilevel facet arthropathy. Not associated with prevertebral soft tissue swelling. 5. Pulmonary emphysema, moderate at the lung apices. 6. Chronic RIGHT maxillary sinusitis complete opacification of the RIGHT maxillary sinus is noted. Correlate with any symptoms of sinus disease. Emphysema (ICD10-J43.9). Electronically Signed   By: Zetta Bills M.D.   On: 10/18/2021 14:51   DG Chest Portable 1 View  Result Date: 10/18/2021 CLINICAL DATA:  Fall, tachycardia EXAM: PORTABLE CHEST 1 VIEW COMPARISON:  Chest x-ray 01/09/2010 FINDINGS: Heart size and mediastinal contours are within normal limits. Stable likely calcified granuloma in the left upper lobe. Mildly prominent chronic interstitial lung markings. No suspicious focal opacities identified. No pleural effusion or pneumothorax visualized. No acute osseous abnormality appreciated. Old left-sided rib fractures noted. IMPRESSION: Chronic findings with no acute intrathoracic process identified. Electronically Signed   By: Ofilia Neas M.D.   On: 10/18/2021 13:36   ECHOCARDIOGRAM COMPLETE  Result Date: 10/19/2021    ECHOCARDIOGRAM REPORT   Patient Name:   MERRILYN PAGANI Sadlowski Date of Exam: 10/19/2021 Medical Rec #:  [id number]      Height:       64.0 in Accession #:  [id number]     Weight:       130.0 lb Date of Birth:  [birth year]      BSA:          1.629 m Patient Age:    64 years       BP:           112/72 mmHg Patient Gender: F              HR:           99 bpm. Exam Location:   Inpatient Procedure: 2D Echo, Cardiac Doppler and Color Doppler Indications:    I48.91 AFIB  History:        Patient has no prior history of Echocardiogram examinations.                 Risk Factors:Hypertension.  Sonographer:    Beryle Beams Referring Phys: [id number] [place]  1. Left ventricular ejection fraction, by estimation, is 60 to 65%. The left ventricle has normal function. The left ventricle has no regional wall motion abnormalities. Left ventricular diastolic parameters are indeterminate.  2. Right ventricular systolic function is normal. The right ventricular size is normal. Tricuspid regurgitation signal is inadequate for assessing PA pressure.  3. The mitral valve is normal in structure. Trivial mitral valve regurgitation. No evidence of mitral stenosis.  4. The aortic valve is tricuspid. Aortic valve regurgitation is mild. Aortic valve sclerosis/calcification is present, without any evidence of aortic stenosis.  5. The patient is in atrial fibrillation. FINDINGS  Left Ventricle: Left ventricular ejection fraction, by estimation, is 60 to 65%. The left ventricle has normal function. The left ventricle has no regional wall motion abnormalities. The left ventricular internal cavity size was normal in size. There is  no left ventricular hypertrophy. Left ventricular diastolic parameters are indeterminate. Right Ventricle: The right ventricular size is normal. No increase in right ventricular wall thickness. Right ventricular systolic function is normal. Tricuspid regurgitation signal is inadequate for assessing PA pressure. Left Atrium: Left atrial size was normal in size. Right Atrium: Right atrial size was normal in size. Pericardium: There is no evidence of pericardial effusion. Mitral Valve: The mitral valve is normal in structure. There is mild calcification of the mitral valve leaflet(s). Trivial mitral valve regurgitation. No evidence of mitral valve stenosis. Tricuspid Valve: The  tricuspid valve is normal in structure. Tricuspid valve regurgitation is trivial. Aortic Valve: The aortic valve is tricuspid. Aortic valve regurgitation is mild. Aortic regurgitation PHT measures 493 msec. Aortic valve sclerosis/calcification is present, without any evidence of aortic stenosis. Aortic valve mean gradient measures 5.0  mmHg. Aortic valve peak gradient measures 9.2 mmHg. Aortic valve area, by VTI measures 1.13 cm. Pulmonic Valve: The pulmonic valve was normal in structure. Pulmonic valve regurgitation is not visualized. Aorta: The aortic root is normal in size and structure. Venous: The inferior vena cava was not well visualized. IAS/Shunts: No atrial level shunt detected by color flow Doppler.  LEFT VENTRICLE PLAX 2D LVIDd:         3.60 cm LVIDs:         1.90 cm LV PW:         1.00 cm LV IVS:        0.80 cm LVOT diam:     1.70 cm LV SV:         33 LV SV Index:   20 LVOT Area:     2.27 cm  LV Volumes (MOD) LV vol  d, MOD A2C: 35.9 ml LV vol d, MOD A4C: 42.9 ml LV vol s, MOD A2C: 14.1 ml LV vol s, MOD A4C: 14.6 ml LV SV MOD A2C:     21.8 ml LV SV MOD A4C:     42.9 ml LV SV MOD BP:      26.6 ml RIGHT VENTRICLE RV S prime:     14.50 cm/s RVOT diam:      1.90 cm TAPSE (M-mode): 1.6 cm LEFT ATRIUM             Index        RIGHT ATRIUM          Index LA diam:        3.80 cm 2.33 cm/m   RA Area:     7.29 cm LA Vol (A2C):   43.7 ml 26.83 ml/m  RA Volume:   11.70 ml 7.18 ml/m LA Vol (A4C):   25.3 ml 15.53 ml/m LA Biplane Vol: 33.5 ml 20.56 ml/m  AORTIC VALVE                     PULMONIC VALVE AV Area (Vmax):    1.31 cm      PV Vmax:       0.59 m/s AV Area (Vmean):   1.19 cm      PV Vmean:      34.900 cm/s AV Area (VTI):     1.13 cm      PV VTI:        0.099 m AV Vmax:           152.00 cm/s   PV Peak grad:  1.4 mmHg AV Vmean:          101.000 cm/s  PV Mean grad:  1.0 mmHg AV VTI:            0.289 m AV Peak Grad:      9.2 mmHg AV Mean Grad:      5.0 mmHg LVOT Vmax:         87.70 cm/s LVOT Vmean:         52.900 cm/s LVOT VTI:          0.144 m LVOT/AV VTI ratio: 0.50 AI PHT:            493 msec  AORTA Ao Root diam: 3.00 cm Ao Asc diam:  3.50 cm TRICUSPID VALVE TV Peak grad:   20.0 mmHg TV Mean grad:   15.0 mmHg TV Vmax:        2.24 m/s TV Vmean:       190.0 cm/s TV VTI:         0.54 msec  SHUNTS Systemic VTI:  0.14 m Systemic Diam: 1.70 cm Pulmonic Diam: 1.90 cm Dalton McleanMD Electronically signed by Franki Monte Signature Date/Time: 10/19/2021/1:57:20 PM    Final    US Abdomen Limited RUQ (LIVER/GB)  Result Date: 10/18/2021 CLINICAL DATA:  LFT elevation EXAM: ULTRASOUND ABDOMEN LIMITED RIGHT UPPER QUADRANT COMPARISON:  None. FINDINGS: Gallbladder: Extensive cholelithiasis with wall echo shadow sign. Negative sonographic Murphy sign. Minimal wall thickening Common bile duct: Diameter: 9 mm.  No intraductal lesion identified Liver: No focal lesion identified. Within normal limits in parenchymal echogenicity. Portal vein is patent on color Doppler imaging with normal direction of blood flow towards the liver. Other: None. IMPRESSION: Cholelithiasis.  No evidence of acute cholecystitis. Prominent common bile duct measuring 9.2 mm. No intrahepatic ductal dilation. Correlate with LFTs. Electronically Signed  By: Maurine Simmering M.D.   On: 10/18/2021 15:07    Microbiology: Results for orders placed or performed during the [name] encounter of 10/18/21  Resp Panel by RT-PCR (Flu A&B, Covid) Nasopharyngeal Swab     Status: None   Collection Time: 10/18/21  1:12 PM   Specimen: Nasopharyngeal Swab; Nasopharyngeal(NP) swabs in vial transport medium  Result Value Ref Range Status   SARS Coronavirus 2 by RT PCR NEGATIVE NEGATIVE Final    Comment: (NOTE) SARS-CoV-2 target nucleic acids are NOT DETECTED.  The SARS-CoV-2 RNA is generally detectable in upper respiratory specimens during the acute phase of infection. The lowest concentration of SARS-CoV-2 viral copies this assay can detect is 138 copies/mL. A  negative result does not preclude SARS-Cov-2 infection and should not be used as the sole basis for treatment or other patient management decisions. A negative result may occur with  improper specimen collection/handling, submission of specimen other than nasopharyngeal swab, presence of viral mutation(s) within the areas targeted by this assay, and inadequate number of viral copies(<138 copies/mL). A negative result must be combined with clinical observations, patient history, and epidemiological information. The expected result is Negative.  Fact Sheet for Patients:  EntrepreneurPulse.com.au  Fact Sheet for Healthcare Providers:  IncredibleEmployment.be  This test is no t yet approved or cleared by the [place] FDA and  has been authorized for detection and/or diagnosis of SARS-CoV-2 by FDA under an Emergency Use Authorization (EUA). This EUA will remain  in effect (meaning this test can be used) for the duration of the COVID-19 declaration under Section 564(b)(1) of the Act, 21 U.S.C.section 360bbb-3(b)(1), unless the authorization is terminated  or revoked sooner.       Influenza A by PCR NEGATIVE NEGATIVE Final   Influenza B by PCR NEGATIVE NEGATIVE Final    Comment: (NOTE) The Xpert Xpress SARS-CoV-2/FLU/RSV plus assay is intended as an aid in the diagnosis of influenza from Nasopharyngeal swab specimens and should not be used as a sole basis for treatment. Nasal washings and aspirates are unacceptable for Xpert Xpress SARS-CoV-2/FLU/RSV testing.  Fact Sheet for Patients: EntrepreneurPulse.com.au  Fact Sheet for Healthcare Providers: IncredibleEmployment.be  This test is not yet approved or cleared by the [place] FDA and has been authorized for detection and/or diagnosis of SARS-CoV-2 by FDA under an Emergency Use Authorization (EUA). This EUA will remain in effect (meaning this test can  be used) for the duration of the COVID-19 declaration under Section 564(b)(1) of the Act, 21 U.S.C. section 360bbb-3(b)(1), unless the authorization is terminated or revoked.  Performed at [name], [place] [address], [place], [place] [address]   Urine Culture     Status: Abnormal   Collection Time: 10/18/21  1:37 PM   Specimen: Urine, Clean Catch  Result Value Ref Range Status   Specimen Description   Final    URINE, CLEAN CATCH Performed at [name], [place] [address], [place], [place] [address]    Special Requests   Final    NONE Performed at [name], [place] [address], [place], [place] [address]    Culture MULTIPLE SPECIES PRESENT, SUGGEST RECOLLECTION (A)  Final   Report Status 10/21/2021 FINAL  Final    Labs: CBC: Recent Labs  Lab 10/18/21 1312 10/19/21 0500 10/20/21 0146  WBC 25.5* 15.6* 10.1  NEUTROABS 21.6*  --   --   HGB 14.4 12.3 11.5*  HCT 44.9 38.0 36.1  MCV 87.9 88.6 88.9  PLT 240  [id number]   Basic Metabolic Panel: Recent Labs  Lab 10/18/21 1312 10/19/21 0500 10/19/21 1652 10/20/21 0146 10/21/21 0507 10/22/21 0424  NA 144 144  --  140 136 135  K 4.6 2.7* 3.2* 3.1* 4.0 3.6  CL 108 110  --  108 103 101  CO2 23 25  --  27 27 28   GLUCOSE 114* 104*  --  82 83 94  BUN 29* 26*  --  21 12 10   CREATININE 0.51 0.42*  --  0.34* 0.44 0.51  CALCIUM 8.8* 8.2*  --  7.8* 7.9* 8.2*  MG 2.7* 2.4  --   --   --   --    Liver Function Tests: Recent Labs  Lab 10/18/21 1312 10/19/21 0500 10/20/21 0800 10/21/21 0507 10/22/21 0424  AST 393* 247* 152* 125* 90*  ALT 143* 115* 90* 90* 81*  ALKPHOS 55 45 39 43 40  BILITOT 2.3* 0.8 0.5 1.1 0.7  PROT 7.6 6.4* 5.6* 5.7* 5.5*  ALBUMIN 4.0 3.5 3.1* 3.2* 3.1*   CBG: No results for input(s): GLUCAP in the last 168 hours.  Discharge time spent:  35 minutes.  Signed: Cordelia Poche, MD [facility] 10/22/2021

## 2021-10-22 NOTE — TOC Transition Note (Signed)
Transition of Care [facility]) - CM/SW Discharge Note   Patient Details  Name: MAKISHA MARRIN MRN: [id number] Date of Birth: 06-29-1934  Transition of [facility] CM/SW Contact:  Lanier Clam, RN Phone Number: 10/22/2021, 12:26 PM   Clinical Narrative:Spoke to patient & sister Chales Abrahams per patient permission about d/c plans-patient decline SNF,decline HHC-prefers otpt PT-referral placed. States she has a rw even though ordered-she doesn't want additional equipment in home. Per sister has concerns about home environment,informed her that patient wants home. Meals on wheels info given, also will place APS referral. No further CM needs.       Final next level of care: OP Rehab Barriers to Discharge: No Barriers Identified   Patient Goals and CMS Choice Patient states their goals for this hospitalization and ongoing recovery are:: Home CMS Medicare.gov Compare Post Acute Care list provided to:: Patient Choice offered to / list presented to : Patient  Discharge Placement                       Discharge Plan and Services   Discharge Planning Services: CM Consult Post Acute Care Choice: NA                               Social Determinants of Health (SDOH) Interventions     Readmission Risk Interventions No flowsheet data found.

## 2021-10-24 DIAGNOSIS — W19XXXS Unspecified fall, sequela: Secondary | ICD-10-CM | POA: Diagnosis not present

## 2021-10-24 DIAGNOSIS — I10 Essential (primary) hypertension: Secondary | ICD-10-CM | POA: Diagnosis not present

## 2021-10-24 DIAGNOSIS — E039 Hypothyroidism, unspecified: Secondary | ICD-10-CM | POA: Diagnosis not present

## 2021-10-24 DIAGNOSIS — I480 Paroxysmal atrial fibrillation: Secondary | ICD-10-CM | POA: Diagnosis not present

## 2021-10-24 DIAGNOSIS — M6282 Rhabdomyolysis: Secondary | ICD-10-CM | POA: Diagnosis not present

## 2021-10-24 DIAGNOSIS — R54 Age-related physical debility: Secondary | ICD-10-CM | POA: Diagnosis not present

## 2021-10-24 DIAGNOSIS — E876 Hypokalemia: Secondary | ICD-10-CM | POA: Diagnosis not present

## 2021-10-31 DIAGNOSIS — I10 Essential (primary) hypertension: Secondary | ICD-10-CM | POA: Diagnosis not present

## 2021-10-31 DIAGNOSIS — G8929 Other chronic pain: Secondary | ICD-10-CM | POA: Diagnosis not present

## 2021-10-31 DIAGNOSIS — E039 Hypothyroidism, unspecified: Secondary | ICD-10-CM | POA: Diagnosis not present

## 2021-11-14 ENCOUNTER — Ambulatory Visit: Payer: Medicare Other | Attending: Family Medicine | Admitting: Physical Therapy

## 2021-11-14 NOTE — Therapy (Incomplete)
?OUTPATIENT PHYSICAL THERAPY LOWER EXTREMITY EVALUATION ? ? ?Patient Name: Maria [name] ?MRN: [id number] ?DOB:[birth year], 88 y.o., female ?Today's Date: 11/14/2021 ? ? ? ?Past Medical History:  ?Diagnosis Date  ? Hypertension   ? Thyroid disease   ? ?Past Surgical History:  ?Procedure Laterality Date  ? ABDOMINAL HYSTERECTOMY    ? ?Patient Active Problem List  ? Diagnosis Date Noted  ? Pressure injury of skin 10/20/2021  ? Primary hypertension   ? Hypothyroidism   ? Generalized anxiety disorder   ? Traumatic rhabdomyolysis (HCC) 10/18/2021  ? Paroxysmal atrial fibrillation with RVR (HCC) 10/18/2021  ? Dehydration 10/18/2021  ? Elevated LFTs 10/18/2021  ? Falls 10/18/2021  ? Elevated troponin 10/18/2021  ? SIRS (systemic inflammatory response syndrome) (HCC) 10/18/2021  ? ? ?PCP: Lorenda Ishihara, MD ? ?REFERRING PROVIDER: Narda Bonds, MD ? ?THERAPY DIAG:  ?No diagnosis found. ? ?REFERRING DIAG: Unsteady gait [R26.81] ? ?SUBJECTIVE: ? ?PERTINENT PAST HISTORY:  ?Rhabdomyolysis, afib with RVR, HTN, anxiety, falls   ?     ?PRECAUTIONS: Fall ? ?WEIGHT BEARING RESTRICTIONS No ? ?FALLS:  ?Has patient fallen in last 6 months? Yes, Number of falls: *** ? ?LIVING ENVIRONMENT: ?Lives with: {[facility] lives with:25569::"lives with their family"} ?Stairs: {yes/no:20286}; {Stairs:24000} ? ?MOI/History of condition: ? ?Onset date: *** ? ?Maria [name] is a 88 y.o. female who presents to clinic with chief complaint of ***.  *** ? ?From referring provider:  ? ?"Hospital Course: ?Maria [name] is a 88 y.o. female with a history of hypertension, hypothyroidism, anxiety. Patient presented after falling down while in the dark and was subsequently unable to get up. She was found to have evidence of rhabdomyolysis and atrial fibrillation with RVR on admission. IV fluids and diltiazem drip initiated, along with heparin IV. Patient transitioned to home atenolol in addition to Eliquis. Continued IV fluids for rhabdomyolysis. ?   ?Assessment and Plan: ?* Traumatic rhabdomyolysis (HCC)- (present on admission) ?In setting of fall and time down. CK of 9,498 on admission with associated AST/ALT of 393/143 and bilirubin of 2.3. NS IV fluids initiated. CK levels have improved to 1.033 and AST/ALT down to 90/81 prior to discharge" ? ? Red flags:  ?{has/denies:26543} {kerredflag:26542} ? ?Pain:  ?Are you having pain? {yes/no:20286} ?Pain location: *** ?NPRS scale:  ?current {NUMBERS; 0-10:5044}/10  ?average {NUMBERS; 0-10:5044}/10  ?Aggravating factors: *** ? NPRS, highest: {NUMBERS; 0-10:5044}/10 ?Relieving factors: *** ? NPRS: best: {NUMBERS; 0-10:5044}/10 ?Pain description: {PAIN DESCRIPTION:21022940} ?Stage: {Desc; acute/subacute/chronic:13799} ?Stability: {kerbetterworse:26715} ?24 hour pattern: ***  ? ?Occupation: *** ? ?Assistive Device: *** ? ?Hand Dominance: *** ? ?Patient Goals/Specific Activities: *** ? ? ?PLOF: {PLOF:24004} ? ?DIAGNOSTIC FINDINGS: *** ? ? ?OBJECTIVE:  ? ? GENERAL OBSERVATION/GAIT: ?  *** ? ?SENSATION: ? Light touch: {intact/deficits:24005} ? ?PALPATION: ?*** ? ?LE MMT: ? ?MMT Right ?11/14/2021 Left ?11/14/2021  ?Hip flexion (L2, L3)    ?Knee extension (L3)    ?Knee flexion    ?Hip abduction    ?Hip extension    ?Hip external rotation    ?Hip internal rotation    ?Hip adduction    ?Ankle dorsiflexion (L4)    ?Ankle plantarflexion (S1)    ?Ankle inversion    ?Ankle eversion    ?Great Toe ext (L5)    ?Grossly    ? ?(Blank rows = not tested, score listed is out of 5 possible points.  N = WNL, D = diminished, C = clear for gross weakness with myotome testing, * = concordant pain with testing) ? ?  LE ROM: ? ?ROM Right ?11/14/2021 Left ?11/14/2021  ?Hip flexion    ?Hip extension    ?Hip abduction    ?Hip adduction    ?Hip internal rotation    ?Hip external rotation    ?Knee flexion    ?Knee extension    ?Ankle dorsiflexion    ?Ankle plantarflexion    ?Ankle inversion    ?Ankle eversion    ? ?(Blank rows = not tested, N = WNL, * =  concordant pain with testing) ? ?Functional Tests ? ?Functional tests Eval (11/14/2021)   ?BERG BALANCE TEST ?{Sitting to Standing:26801:::1} ?{Standing Unsupported:26802:::1} ?{Sitting Unsupported:26803:::1} ?{Standing to Sitting:26804:::1} ?{Transfers:26805:::1} ?{Standing with eyes closed:26806:::1} ?{Standing with feet together:26807:::1} ?{Reaching forward with outstretched arm:26808:::1} ?{Retrieving object from the floor:26809:::1} ?{Turning to look behind:26810:::1} ?{Turning 360 degrees:26811:::1} ?{Place alternate foot on stool:26812:::1} ?{Standing with one foot in front:26813:::1} ?{Standing on one foot:26814:::1} ? ?Total Score: ***/56 ? ***   ?30'' STS ***   ?2 MWT ***   ?    ?    ?    ?    ?    ?    ?    ?    ?    ?    ?    ? ? ?PATIENT SURVEYS:  ?{rehab surveys:24030} ? ? ?TODAY'S TREATMENT: ?*** ? ? ?PATIENT EDUCATION:  ?POC, diagnosis, prognosis, HEP, and outcome measures.  Pt educated via explanation, demonstration, and handout (HEP).  Pt confirms understanding verbally.  ? ?ASTERISK SIGNS ? ? ?Asterisk Signs Eval (11/14/2021)       ?        ?        ?        ?        ?        ? ? ?HOME EXERCISE PROGRAM: ?*** ? ?ASSESSMENT: ? ?CLINICAL IMPRESSION: ?Maria [name] is a 88 y.o. female who presents to clinic with signs and sxs consistent with ***.   ? ?OBJECTIVE IMPAIRMENTS: Pain, *** ? ?ACTIVITY LIMITATIONS: *** ? ?PERSONAL FACTORS: See medical history and pertinent history ? ? ?REHAB POTENTIAL: {rehabpotential:25112} ? ?CLINICAL DECISION MAKING: {clinical decision making:25114} ? ?EVALUATION COMPLEXITY: {Evaluation complexity:25115} ? ? ?GOALS: ? ? ?SHORT TERM GOALS: ? ?Maria [name] will be >75% HEP compliant to improve carryover between sessions and facilitate independent management of condition ? ?Evaluation (11/14/2021): ongoing ?Target date: 01/09/2022 ?Goal status: INITIAL ? ? ?LONG TERM GOALS: ? ?*** ? ? ?2.  *** ? ? ?3.  *** ? ? ?4.  *** ? ? ?5.  *** ? ? ?6.  *** ? ? ?PLAN: ?PT FREQUENCY: 1-2x/week ? ?PT  DURATION: 8 weeks (Ending 01/09/2022) ? ?PLANNED INTERVENTIONS: Therapeutic exercises, Aquatic therapy, Therapeutic activity, Neuro Muscular re-education, Gait training, Patient/Family education, Joint mobilization, Dry Needling, Electrical stimulation, Spinal mobilization and/or manipulation, Moist heat, Taping, Vasopneumatic device, Ionotophoresis 4mg /ml Dexamethasone, and Manual therapy ? ?PLAN FOR NEXT SESSION: *** ? ? ? PT, DPT ?11/14/2021, 9:25 AM ?

## 2021-12-04 DIAGNOSIS — R54 Age-related physical debility: Secondary | ICD-10-CM | POA: Diagnosis not present

## 2021-12-04 DIAGNOSIS — E039 Hypothyroidism, unspecified: Secondary | ICD-10-CM | POA: Diagnosis not present

## 2021-12-04 DIAGNOSIS — T796XXS Traumatic ischemia of muscle, sequela: Secondary | ICD-10-CM | POA: Diagnosis not present

## 2021-12-04 DIAGNOSIS — I480 Paroxysmal atrial fibrillation: Secondary | ICD-10-CM | POA: Diagnosis not present

## 2021-12-04 DIAGNOSIS — I10 Essential (primary) hypertension: Secondary | ICD-10-CM | POA: Diagnosis not present

## 2021-12-18 ENCOUNTER — Encounter: Payer: Self-pay | Admitting: Cardiology

## 2021-12-18 ENCOUNTER — Ambulatory Visit: Payer: Medicare Other | Admitting: Cardiology

## 2021-12-18 VITALS — BP 123/77 | HR 104 | Temp 98.0°F | Resp 16 | Ht 64.0 in | Wt 140.0 lb

## 2021-12-18 DIAGNOSIS — Z7901 Long term (current) use of anticoagulants: Secondary | ICD-10-CM | POA: Diagnosis not present

## 2021-12-18 DIAGNOSIS — E039 Hypothyroidism, unspecified: Secondary | ICD-10-CM | POA: Diagnosis not present

## 2021-12-18 DIAGNOSIS — I4819 Other persistent atrial fibrillation: Secondary | ICD-10-CM | POA: Diagnosis not present

## 2021-12-18 DIAGNOSIS — I10 Essential (primary) hypertension: Secondary | ICD-10-CM

## 2021-12-18 MED ORDER — METOPROLOL SUCCINATE ER 50 MG PO TB24: 50.0000 mg | ORAL_TABLET | Freq: Every morning | ORAL | 0 refills | Status: DC

## 2021-12-18 NOTE — Progress Notes (Signed)
? ?ID:  TRANISE [name], DOB 1933/12/09, MRN [id number] ? ?PCP:  [name], MD  ?Cardiologist:  Rex Kras, DO, [facility] (established care 12/18/2021) ? ?REASON FOR CONSULT: Atrial fibrillation ? ?REQUESTING PHYSICIAN:  ?[name], MD ?[address] ?[address] ?[place],  [place] [address] ? ?Chief Complaint  ?Patient presents with  ? Atrial Fibrillation  ? New Patient (Initial Visit)  ? ? ?HPI  ?Maria [name] is a 88 y.o. 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  ? ?She is referred to the office at the request of [name],* for evaluation of atrial fibrillation. ? ?Patient was hospitalized in February 2023 after a mechanical fall while moving furniture.  She was unable to stand up after the fall and was on the floor for several hours found by her neighbor.  During her hospitalization she had rhabdomyolysis and was noted to be in A-fib with RVR.  Initially was on Cardizem drip and IV heparin.  She was transitioned to her home dose of atenolol and Eliquis at the time of discharge.  However she refused to be on Eliquis at that time and did not want to be evaluated by cardiology per the discharge summary. ? ?She now is referred to cardiology for evaluation and management of atrial fibrillation.  This is a new finding as of February 2023 according to the patient.  She has been on atenolol 75 mg p.o. daily.  Patient claims that she just received her Eliquis prescription and has not been taking it but plans to start later today. ? ?She denies chest pain or heart failure symptoms. ? ?She lives independently but does have a life alert necklace.  No prior history of intracranial or gastrointestinal bleeding. ? ?FUNCTIONAL STATUS: ?No structured exercise program or daily routine. Lives alone, walks w/ cane, and does her ADLs.    ? ?ALLERGIES: ?Allergies  ?Allergen Reactions  ? Clindamycin Shortness Of Breath, Rash and Other (See Comments)  ?  Possible shortness of breath- definitely caused a congested nose  ? Diclofenac Sodium Rash and Other (See Comments)  ?  Nasal congestion, also  ? Nsaids Rash and Other (See Comments)  ?  Nasal congestion, also  ? ? ?MEDICATION LIST PRIOR TO VISIT: ?Current Meds  ?Medication Sig  ? acetaminophen (TYLENOL) 325 MG tablet Take 650 mg by mouth in the morning and at bedtime.  ? acetaminophen (TYLENOL) 325 MG tablet 1 tablet as needed  ? alendronate (FOSAMAX) 70 MG tablet Take 70 mg by mouth every Monday. Take with a full glass of water on an empty stomach.  ? Cholecalciferol (VITAMIN D3) 50 MCG (2000 UT) TABS Take 1 tablet by mouth daily.  ? ELIQUIS 5 MG TABS tablet Take 5 mg by mouth 2 (two) times daily.  ? hydrochlorothiazide (HYDRODIURIL) 25 MG tablet Take 25 mg by mouth in the morning.  ? levothyroxine (SYNTHROID) 88 MCG tablet Take 88 mcg by mouth daily before breakfast.  ? metoprolol succinate (TOPROL-XL) 50 MG 24 hr tablet Take 1 tablet (50 mg total) by mouth in the morning. Take with or immediately following a meal.  ? Oyster Shell 500 MG TABS Take 500 mg by mouth daily.  ? potassium chloride SA (KLOR-CON M) 20 MEQ tablet Take 20 mEq by mouth daily with breakfast.  ? pyridoxine (B-6) 100 MG tablet Take 100 mg by mouth daily.  ? sertraline (ZOLOFT) 50 MG tablet  Take 50 mg by mouth daily.  ? vitamin C (ASCORBIC ACID) 500 MG tablet Take 500 mg by mouth daily.  ? [DISCONTINUED] atenolol (TENORMIN) 50 MG tablet Take 75 mg by mouth daily.  ?  ? ?PAST MEDICAL HISTORY: ?Past Medical History:  ?Diagnosis Date  ? Atrial fibrillation ([place])   ? Dyslipidemia   ? Hypertension   ? Hypothyroid   ? Thyroid disease   ? ? ?PAST SURGICAL HISTORY: ?Past Surgical History:  ?Procedure Laterality Date  ? ABDOMINAL HYSTERECTOMY    ? ? ?FAMILY HISTORY: ?The patient family history includes Heart disease in her brother,  father, and mother; Hypertension in her father and mother. ? ?SOCIAL HISTORY:  ?The patient  reports that she quit smoking about 31 years ago. Her smoking use included cigarettes. She has a 3.00 pack-year smoking history. She has never been exposed to tobacco smoke. She does not have any smokeless tobacco history on file. She reports that she does not drink alcohol and does not use drugs. ? ?REVIEW OF SYSTEMS: ?Review of Systems  ?Cardiovascular:  Positive for irregular heartbeat. Negative for chest pain, claudication, dyspnea on exertion, leg swelling, near-syncope, orthopnea, palpitations, paroxysmal nocturnal dyspnea and syncope.  ?Respiratory:  Negative for shortness of breath.   ? ?PHYSICAL EXAM: ? ?  12/18/2021  ? 10:51 AM 10/22/2021  ? 10:05 AM 10/22/2021  ?  5:05 AM  ?Vitals with BMI  ?Height 5\' 4"     ?Weight 140 lbs    ?BMI 24.02    ?Systolic [id number]  ?Diastolic 77 91 72  ?Pulse 104 95 95  ? ? ?CONSTITUTIONAL: Age appropriate, walks w/ cane, hemodynamically stable, no acute distress.   ?SKIN: Skin is warm and dry. No rash noted. No cyanosis. No pallor. No jaundice ?HEAD: Normocephalic and atraumatic.  ?EYES: No scleral icterus ?MOUTH/THROAT: Moist oral membranes.  ?NECK: No JVD present. No thyromegaly noted. No carotid bruits  ?LYMPHATIC: No visible cervical adenopathy.  ?CHEST Normal respiratory effort. No intercostal retractions  ?LUNGS: Clear to auscultation bilaterally.  No stridor. No wheezes. No rales.  ?CARDIOVASCULAR: Irregularly irregular, tachycardic, variable S1-S2, no murmurs rubs or gallops appreciated.   ?ABDOMINAL: Soft, nontender, nondistended, positive bowel sounds in all 4 quadrants, no apparent ascites.  ?EXTREMITIES: No peripheral edema, warm to touch ?HEMATOLOGIC: No significant bruising ?NEUROLOGIC: Oriented to person, place, and time. Nonfocal. Normal muscle tone.  ?PSYCHIATRIC: Normal mood and affect. Normal behavior. Cooperative ? ?CARDIAC DATABASE: ?EKG: ?12/18/2021: Atrial  fibrillation, 107 bpm, normal axis, poor R wave progression, without underlying ischemia injury pattern. ? ?Echocardiogram: ?10/19/2021 ? 1. Left ventricular ejection fraction, by estimation, is 60 to 65%. The left ventricle has normal function. The left ventricle has no regional wall motion abnormalities. Left ventricular diastolic parameters are indeterminate.  ? 2. Right ventricular systolic function is normal. The right ventricular size is normal. Tricuspid regurgitation signal is inadequate for assessing PA pressure.  ? 3. The mitral valve is normal in structure. Trivial mitral valve regurgitation. No evidence of mitral stenosis.  ? 4. The aortic valve is tricuspid. Aortic valve regurgitation is mild.  ?Aortic valve sclerosis/calcification is present, without any evidence of aortic stenosis.  ? 5. The patient is in atrial fibrillation.  ? ?Stress Testing: ?No results found for this or any previous visit from the past 1095 days. ? ? ?Heart Catheterization: ?None ? ?LABORATORY DATA: ? ?  Latest Ref Rng & Units 10/20/2021  ?  1:46 AM 10/19/2021  ?  5:00 AM 10/18/2021  ?  1:12 PM  ?CBC  ?WBC 4.0 - 10.5 K/uL 10.1   15.6   25.5    ?Hemoglobin 12.0 - 15.0 g/dL 11.5   12.3   14.4    ?Hematocrit 36.0 - 46.0 % 36.1   38.0   44.9    ?Platelets 150 - 400 K/uL 196   197   240    ? ? ? ?  Latest Ref Rng & Units 10/22/2021  ?  4:24 AM 10/21/2021  ?  5:07 AM 10/20/2021  ?  8:00 AM  ?CMP  ?Glucose 70 - 99 mg/dL 94   83     ?BUN 8 - 23 mg/dL 10   12     ?Creatinine 0.44 - 1.00 mg/dL 0.51   0.44     ?Sodium 135 - 145 mmol/L 135   136     ?Potassium 3.5 - 5.1 mmol/L 3.6   4.0     ?Chloride 98 - 111 mmol/L 101   103     ?CO2 22 - 32 mmol/L 28   27     ?Calcium 8.9 - 10.3 mg/dL 8.2   7.9     ?Total Protein 6.5 - 8.1 g/dL 5.5   5.7   5.6    ?Total Bilirubin 0.3 - 1.2 mg/dL 0.7   1.1   0.5    ?Alkaline Phos 38 - 126 U/L 40   43   39    ?AST 15 - 41 U/L 90   125   152    ?ALT 0 - 44 U/L 81   90   90    ? ? ?Lipid Panel  ?No results found  for: CHOL, TRIG, HDL, CHOLHDL, VLDL, LDLCALC, LDLDIRECT, LABVLDL ? ?No components found for: NTPROBNP ?No results for input(s): PROBNP in the last 8760 hours. ?Recent Labs  ?  10/18/21 ?1312  ?TSH 1.228  ? ? ?BMP ?Recent Labs  ?  02/

## 2021-12-18 NOTE — Patient Instructions (Signed)
Once the metoprolol was delivered by her pharmacy stop taking atenolol. ? ?Check your pulse and vital signs daily.  If your pulse is greater than 60 bpm please call the office for further up titration of metoprolol. ? ?I will see you back in 4 weeks. ?

## 2022-01-01 DIAGNOSIS — G8929 Other chronic pain: Secondary | ICD-10-CM | POA: Diagnosis not present

## 2022-01-01 DIAGNOSIS — I10 Essential (primary) hypertension: Secondary | ICD-10-CM | POA: Diagnosis not present

## 2022-01-01 DIAGNOSIS — E039 Hypothyroidism, unspecified: Secondary | ICD-10-CM | POA: Diagnosis not present

## 2022-01-01 DIAGNOSIS — I480 Paroxysmal atrial fibrillation: Secondary | ICD-10-CM | POA: Diagnosis not present

## 2022-01-01 DIAGNOSIS — K219 Gastro-esophageal reflux disease without esophagitis: Secondary | ICD-10-CM | POA: Diagnosis not present

## 2022-01-01 DIAGNOSIS — E785 Hyperlipidemia, unspecified: Secondary | ICD-10-CM | POA: Diagnosis not present

## 2022-01-15 ENCOUNTER — Encounter: Payer: Self-pay | Admitting: Cardiology

## 2022-01-15 ENCOUNTER — Ambulatory Visit: Payer: Medicare Other | Admitting: Cardiology

## 2022-01-15 VITALS — BP 119/71 | HR 98 | Temp 98.2°F | Ht 64.0 in | Wt 129.2 lb

## 2022-01-15 DIAGNOSIS — Z7901 Long term (current) use of anticoagulants: Secondary | ICD-10-CM

## 2022-01-15 DIAGNOSIS — E039 Hypothyroidism, unspecified: Secondary | ICD-10-CM | POA: Diagnosis not present

## 2022-01-15 DIAGNOSIS — I4819 Other persistent atrial fibrillation: Secondary | ICD-10-CM | POA: Diagnosis not present

## 2022-01-15 DIAGNOSIS — I10 Essential (primary) hypertension: Secondary | ICD-10-CM

## 2022-01-15 MED ORDER — METOPROLOL SUCCINATE ER 50 MG PO TB24: 50.0000 mg | ORAL_TABLET | Freq: Every morning | ORAL | 0 refills | Status: DC

## 2022-01-15 NOTE — Progress Notes (Signed)
? ?ID:  Maria [name], DOB [birth year], MRN [id number] ? ?PCP:  Lorenda Ishihara, MD  ?Cardiologist:  Tessa Lerner, DO, [facility] (established care 12/18/2021) ? ?Date: 01/15/22 ?Last Office Visit: 12/18/2021 ? ?Chief Complaint  ?Patient presents with  ? Atrial Fibrillation  ? Follow-up  ?  4 weeks  ? ? ?HPI  ?Maria [name] is a 88 y.o. 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  ? ?She is referred to the office at the request of Lorenda Ishihara,* for evaluation of atrial fibrillation. ? ?In February 2023 after a mechanical fall she was hospitalized for rhabdomyolysis and was noted to be in A-fib with RVR.  At that time she was started on Cardizem and heparin drip.  Was discharged home on atenolol and Eliquis.  However Eliquis was not initiated until she was seen in consultation on December 18, 2021. ? ?At last office visit I transitioned her from atenolol to metoprolol for rate control strategy and the plan was to follow-up in 4 weeks after being on oral anticoagulation.  Patient states that she has been on oral anticoagulation since last office visit with the bottle for Eliquis that she brings in today's unopened which raises a suspicion for noncompliance.  In addition, she was given metoprolol at last office visit which is also not with her at today's office visit. ? ?I asked if she gives me permission to discuss her condition and plan of care with the next of kin.  However, it appears that her son is in jail and she does not want to discuss it further with her brothers and sisters.  She wants the neighbor to make decisions for her if needed but does not have a DPOA assigned. ? ?She denies chest pain or heart failure symptoms. ? ?Home blood pressures are <157mmHG and pulse varies between 90-110 bpm per patient. ? ?She lives independently but does have a life  alert necklace.  No prior history of intracranial or gastrointestinal bleeding. ? ?FUNCTIONAL STATUS: ?No structured exercise program or daily routine. Lives alone, walks w/ cane, and does her ADLs.   ? ?ALLERGIES: ?Allergies  ?Allergen Reactions  ? Clindamycin Shortness Of Breath, Rash and Other (See Comments)  ?  Possible shortness of breath- definitely caused a congested nose  ? Diclofenac Sodium Rash and Other (See Comments)  ?  Nasal congestion, also  ? Nsaids Rash and Other (See Comments)  ?  Nasal congestion, also  ? ? ?MEDICATION LIST PRIOR TO VISIT: ?Current Meds  ?Medication Sig  ? acetaminophen (TYLENOL) 325 MG tablet Take 650 mg by mouth in the morning and at bedtime.  ? alendronate (FOSAMAX) 70 MG tablet Take 70 mg by mouth every Monday. Take with a full glass of water on an empty stomach.  ? Cholecalciferol (VITAMIN D3) 50 MCG (2000 UT) TABS Take 1 tablet by mouth daily.  ? ELIQUIS 5 MG TABS tablet Take 5 mg by mouth 2 (two) times daily.  ? hydrochlorothiazide (HYDRODIURIL) 25 MG tablet Take 25 mg by mouth in the morning.  ? levothyroxine (SYNTHROID) 88 MCG tablet Take 88 mcg by mouth daily before breakfast.  ? Oyster Shell 500 MG TABS Take 500 mg by mouth daily.  ? potassium chloride SA (KLOR-CON M) 20 MEQ tablet Take 20 mEq by mouth daily with breakfast.  ? pyridoxine (B-6) 100 MG tablet Take 100 mg by mouth daily.  ? sertraline (ZOLOFT)  50 MG tablet Take 50 mg by mouth daily.  ? vitamin C (ASCORBIC ACID) 500 MG tablet Take 500 mg by mouth daily.  ?  ? ?PAST MEDICAL HISTORY: ?Past Medical History:  ?Diagnosis Date  ? Atrial fibrillation ([facility])   ? Dyslipidemia   ? Hypertension   ? Hypothyroid   ? Thyroid disease   ? ? ?PAST SURGICAL HISTORY: ?Past Surgical History:  ?Procedure Laterality Date  ? ABDOMINAL HYSTERECTOMY    ? ? ?FAMILY HISTORY: ?The patient family history includes Heart disease in her brother, father, and mother; Hypertension in her father and mother. ? ?SOCIAL HISTORY:  ?The patient   reports that she quit smoking about 31 years ago. Her smoking use included cigarettes. She has a 3.00 pack-year smoking history. She has never been exposed to tobacco smoke. She does not have any smokeless tobacco history on file. She reports that she does not drink alcohol and does not use drugs. ? ?REVIEW OF SYSTEMS: ?Review of Systems  ?Cardiovascular:  Positive for irregular heartbeat. Negative for chest pain, claudication, dyspnea on exertion, leg swelling, near-syncope, orthopnea, palpitations, paroxysmal nocturnal dyspnea and syncope.  ?Respiratory:  Negative for shortness of breath.   ? ?PHYSICAL EXAM: ? ?  01/15/2022  ? 11:14 AM 12/18/2021  ? 10:51 AM 10/22/2021  ? 10:05 AM  ?Vitals with BMI  ?Height 5\' 4"  5\' 4"    ?Weight 129 lbs 3 oz 140 lbs   ?BMI 22.17 24.02   ?Systolic [id number]  ?Diastolic 71 77 91  ?Pulse 98 104 95  ? ? ?CONSTITUTIONAL: Age appropriate, walks w/ cane, hemodynamically stable, no acute distress.   ?SKIN: Skin is warm and dry. No rash noted. No cyanosis. No pallor. No jaundice ?HEAD: Normocephalic and atraumatic.  ?EYES: No scleral icterus ?MOUTH/THROAT: Moist oral membranes.  ?NECK: No JVD present. No thyromegaly noted. No carotid bruits  ?LYMPHATIC: No visible cervical adenopathy.  ?CHEST Normal respiratory effort. No intercostal retractions  ?LUNGS: Clear to auscultation bilaterally.  No stridor. No wheezes. No rales.  ?CARDIOVASCULAR: Irregularly irregular, tachycardic, variable S1-S2, no murmurs rubs or gallops appreciated.   ?ABDOMINAL: Soft, nontender, nondistended, positive bowel sounds in all 4 quadrants, no apparent ascites.  ?EXTREMITIES: No peripheral edema, warm to touch ?HEMATOLOGIC: No significant bruising ?NEUROLOGIC: Oriented to person, place, and time. Nonfocal. Normal muscle tone.  ?PSYCHIATRIC: Normal mood and affect. Normal behavior. Cooperative. ?No significant change since last office visit. ? ?CARDIAC DATABASE: ?EKG: ?01/15/2022: Atrial fibrillation, 111  bpm. ? ?Echocardiogram: ?10/19/2021 ? 1. Left ventricular ejection fraction, by estimation, is 60 to 65%. The left ventricle has normal function. The left ventricle has no regional wall motion abnormalities. Left ventricular diastolic parameters are indeterminate.  ? 2. Right ventricular systolic function is normal. The right ventricular size is normal. Tricuspid regurgitation signal is inadequate for assessing PA pressure.  ? 3. The mitral valve is normal in structure. Trivial mitral valve regurgitation. No evidence of mitral stenosis.  ? 4. The aortic valve is tricuspid. Aortic valve regurgitation is mild.  ?Aortic valve sclerosis/calcification is present, without any evidence of aortic stenosis.  ? 5. The patient is in atrial fibrillation.  ? ?Stress Testing: ?No results found for this or any previous visit from the past 1095 days. ? ? ?Heart Catheterization: ?None ? ?LABORATORY DATA: ? ?  Latest Ref Rng & Units 10/20/2021  ?  1:46 AM 10/19/2021  ?  5:00 AM 10/18/2021  ?  1:12 PM  ?CBC  ?WBC 4.0 - 10.5 K/uL 10.1  15.6   25.5    ?Hemoglobin 12.0 - 15.0 g/dL 11.5   12.3   14.4    ?Hematocrit 36.0 - 46.0 % 36.1   38.0   44.9    ?Platelets 150 - 400 K/uL 196   197   240    ? ? ? ?  Latest Ref Rng & Units 10/22/2021  ?  4:24 AM 10/21/2021  ?  5:07 AM 10/20/2021  ?  8:00 AM  ?CMP  ?Glucose 70 - 99 mg/dL 94   83     ?BUN 8 - 23 mg/dL 10   12     ?Creatinine 0.44 - 1.00 mg/dL 0.51   0.44     ?Sodium 135 - 145 mmol/L 135   136     ?Potassium 3.5 - 5.1 mmol/L 3.6   4.0     ?Chloride 98 - 111 mmol/L 101   103     ?CO2 22 - 32 mmol/L 28   27     ?Calcium 8.9 - 10.3 mg/dL 8.2   7.9     ?Total Protein 6.5 - 8.1 g/dL 5.5   5.7   5.6    ?Total Bilirubin 0.3 - 1.2 mg/dL 0.7   1.1   0.5    ?Alkaline Phos 38 - 126 U/L 40   43   39    ?AST 15 - 41 U/L 90   125   152    ?ALT 0 - 44 U/L 81   90   90    ? ? ?Lipid Panel  ?No results found for: CHOL, TRIG, HDL, CHOLHDL, VLDL, LDLCALC, LDLDIRECT, LABVLDL ? ?No components found for:  NTPROBNP ?No results for input(s): PROBNP in the last 8760 hours. ?Recent Labs  ?  10/18/21 ?1312  ?TSH 1.228  ? ? ?BMP ?Recent Labs  ?  10/20/21 ?0146 10/21/21 ?0507 10/22/21 ?0424  ?NA 140 136 135  ?K 3.1* 4.0 3.6  ?CL 108 10

## 2022-01-21 ENCOUNTER — Other Ambulatory Visit: Payer: Self-pay | Admitting: Cardiology

## 2022-01-21 DIAGNOSIS — Z7901 Long term (current) use of anticoagulants: Secondary | ICD-10-CM

## 2022-01-21 DIAGNOSIS — I4819 Other persistent atrial fibrillation: Secondary | ICD-10-CM

## 2022-02-26 ENCOUNTER — Ambulatory Visit: Payer: Medicare Other | Admitting: Cardiology

## 2022-02-26 ENCOUNTER — Encounter: Payer: Self-pay | Admitting: Cardiology

## 2022-02-26 VITALS — BP 122/76 | HR 97 | Temp 98.7°F | Resp 16 | Ht 64.0 in | Wt 142.0 lb

## 2022-02-26 DIAGNOSIS — E039 Hypothyroidism, unspecified: Secondary | ICD-10-CM

## 2022-02-26 DIAGNOSIS — Z7901 Long term (current) use of anticoagulants: Secondary | ICD-10-CM

## 2022-02-26 DIAGNOSIS — I4819 Other persistent atrial fibrillation: Secondary | ICD-10-CM

## 2022-02-26 DIAGNOSIS — I10 Essential (primary) hypertension: Secondary | ICD-10-CM

## 2022-02-26 MED ORDER — DILTIAZEM HCL ER COATED BEADS 180 MG PO CP24: 180.0000 mg | ORAL_CAPSULE | Freq: Every morning | ORAL | 0 refills | Status: DC

## 2022-02-28 DIAGNOSIS — G8929 Other chronic pain: Secondary | ICD-10-CM | POA: Diagnosis not present

## 2022-02-28 DIAGNOSIS — K219 Gastro-esophageal reflux disease without esophagitis: Secondary | ICD-10-CM | POA: Diagnosis not present

## 2022-02-28 DIAGNOSIS — I10 Essential (primary) hypertension: Secondary | ICD-10-CM | POA: Diagnosis not present

## 2022-02-28 DIAGNOSIS — E039 Hypothyroidism, unspecified: Secondary | ICD-10-CM | POA: Diagnosis not present

## 2022-02-28 DIAGNOSIS — E785 Hyperlipidemia, unspecified: Secondary | ICD-10-CM | POA: Diagnosis not present

## 2022-02-28 DIAGNOSIS — R54 Age-related physical debility: Secondary | ICD-10-CM | POA: Diagnosis not present

## 2022-03-19 DIAGNOSIS — M85859 Other specified disorders of bone density and structure, unspecified thigh: Secondary | ICD-10-CM | POA: Diagnosis not present

## 2022-03-19 DIAGNOSIS — Z0000 Encounter for general adult medical examination without abnormal findings: Secondary | ICD-10-CM | POA: Diagnosis not present

## 2022-03-19 DIAGNOSIS — E785 Hyperlipidemia, unspecified: Secondary | ICD-10-CM | POA: Diagnosis not present

## 2022-03-19 DIAGNOSIS — E039 Hypothyroidism, unspecified: Secondary | ICD-10-CM | POA: Diagnosis not present

## 2022-03-19 DIAGNOSIS — K219 Gastro-esophageal reflux disease without esophagitis: Secondary | ICD-10-CM | POA: Diagnosis not present

## 2022-03-19 DIAGNOSIS — I10 Essential (primary) hypertension: Secondary | ICD-10-CM | POA: Diagnosis not present

## 2022-03-19 DIAGNOSIS — Z9181 History of falling: Secondary | ICD-10-CM | POA: Diagnosis not present

## 2022-03-19 DIAGNOSIS — R54 Age-related physical debility: Secondary | ICD-10-CM | POA: Diagnosis not present

## 2022-03-19 DIAGNOSIS — I480 Paroxysmal atrial fibrillation: Secondary | ICD-10-CM | POA: Diagnosis not present

## 2022-03-29 DIAGNOSIS — I480 Paroxysmal atrial fibrillation: Secondary | ICD-10-CM | POA: Diagnosis not present

## 2022-03-29 DIAGNOSIS — E039 Hypothyroidism, unspecified: Secondary | ICD-10-CM | POA: Diagnosis not present

## 2022-03-29 DIAGNOSIS — G8929 Other chronic pain: Secondary | ICD-10-CM | POA: Diagnosis not present

## 2022-03-29 DIAGNOSIS — E785 Hyperlipidemia, unspecified: Secondary | ICD-10-CM | POA: Diagnosis not present

## 2022-03-29 DIAGNOSIS — I10 Essential (primary) hypertension: Secondary | ICD-10-CM | POA: Diagnosis not present

## 2022-03-29 DIAGNOSIS — K219 Gastro-esophageal reflux disease without esophagitis: Secondary | ICD-10-CM | POA: Diagnosis not present

## 2022-04-11 ENCOUNTER — Encounter: Payer: Self-pay | Admitting: Cardiology

## 2022-04-11 ENCOUNTER — Ambulatory Visit: Payer: Medicare Other | Admitting: Cardiology

## 2022-04-11 VITALS — BP 138/83 | HR 97 | Temp 98.6°F | Resp 16 | Ht 64.0 in | Wt 138.0 lb

## 2022-04-11 DIAGNOSIS — E039 Hypothyroidism, unspecified: Secondary | ICD-10-CM | POA: Diagnosis not present

## 2022-04-11 DIAGNOSIS — I10 Essential (primary) hypertension: Secondary | ICD-10-CM | POA: Diagnosis not present

## 2022-04-11 DIAGNOSIS — I4819 Other persistent atrial fibrillation: Secondary | ICD-10-CM | POA: Diagnosis not present

## 2022-04-11 DIAGNOSIS — Z7901 Long term (current) use of anticoagulants: Secondary | ICD-10-CM | POA: Diagnosis not present

## 2022-04-11 MED ORDER — DILTIAZEM HCL ER COATED BEADS 300 MG PO CP24: 300.0000 mg | ORAL_CAPSULE | Freq: Every morning | ORAL | 0 refills | Status: DC

## 2022-04-11 NOTE — Progress Notes (Signed)
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   She is referred to the office at the request of Lorenda Ishihara,* for evaluation of atrial fibrillation.  Diagnosed with atrial fibrillation in February 2023 but did not initiate Eliquis for thromboembolic prophylaxis until last office visit.  Since in the past she has not been compliant with anticoagulation we chose to proceed with rate control strategy for now.  We have discussed undergoing either TEE guided cardioversion or cardioversion after being on oral anticoagulation consistently for 4 weeks.  She is here today to discuss management of atrial fibrillation.  Since last office visit patient has been on her medical therapy.  She remains in atrial fibrillation but her ventricular rate has improved.  She clinically also feels better with regards to more energy.  She denies anginal chest pain or heart failure symptoms.  Patient does not endorse evidence of bleeding.  In the past I did offer the patient to discuss her condition and plan of care with her next of kin.  However, patient stated that her son was in jail and she does not want her brothers/sisters involved in her care.  She lives independently but does have a life alert necklace.    FUNCTIONAL STATUS: No structured exercise program or daily routine. Lives alone, walks w/ cane, and does her ADLs.    ALLERGIES: Allergies  Allergen Reactions   Clindamycin Shortness Of Breath, Rash and Other (See Comments)    Possible  shortness of breath- definitely caused a congested nose   Diclofenac Sodium Rash and Other (See Comments)    Nasal congestion, also   Nsaids Rash and Other (See Comments)    Nasal congestion, also    MEDICATION LIST PRIOR TO VISIT: Current Meds  Medication Sig   acetaminophen (TYLENOL) 325 MG tablet Take 650 mg by mouth in the morning and at bedtime.   alendronate (FOSAMAX) 70 MG tablet Take 70 mg by mouth every Monday. Take with a full glass of water on an empty stomach.   Cholecalciferol (VITAMIN D3) 50 MCG (2000 UT) TABS Take 1 tablet by mouth daily.   diltiazem (CARDIZEM CD) 300 MG 24 hr capsule Take 1 capsule (300 mg total) by mouth every morning.   ELIQUIS 5 MG TABS tablet Take 5 mg by mouth 2 (two) times daily.   hydrochlorothiazide (HYDRODIURIL) 25 MG tablet Take 25 mg by mouth in the morning.   levothyroxine (SYNTHROID) 88 MCG tablet Take 88 mcg by mouth daily before breakfast.   Oyster Shell 500 MG TABS Take 500 mg by mouth daily.   potassium chloride SA (KLOR-CON M) 20 MEQ tablet Take 20 mEq by mouth daily with breakfast.   pyridoxine (B-6) 100 MG tablet Take 100 mg by mouth daily.   sertraline (ZOLOFT) 50 MG tablet Take 50 mg by mouth daily.   vitamin C (ASCORBIC ACID) 500 MG tablet Take 500 mg by mouth daily.   [DISCONTINUED] DILT-XR 180 MG 24 hr capsule Take 180 mg by mouth daily.     PAST MEDICAL HISTORY: Past Medical History:  Diagnosis Date   Atrial fibrillation (HCC)    Dyslipidemia    Hypertension    Hypothyroid    Thyroid disease     PAST SURGICAL HISTORY: Past Surgical History:  Procedure Laterality Date   ABDOMINAL HYSTERECTOMY      FAMILY HISTORY: The patient family history includes Heart disease in her brother, father, and mother; Hypertension in her father and mother.  SOCIAL HISTORY:  The patient  reports that she quit smoking about 31 years ago. Her smoking use included cigarettes. She has a 3.00 pack-year smoking history. She has never been  exposed to tobacco smoke. She does not have any smokeless tobacco history on file. She reports that she does not drink alcohol and does not use drugs.  REVIEW OF SYSTEMS: Review of Systems  Cardiovascular:  Positive for irregular heartbeat. Negative for chest pain, claudication, dyspnea on exertion, leg swelling, near-syncope, orthopnea, palpitations, paroxysmal nocturnal dyspnea and syncope.  Respiratory:  Negative for shortness of breath.     PHYSICAL EXAM:    04/11/2022   11:44 AM 02/26/2022   11:12 AM 01/15/2022   11:14 AM  Vitals with BMI  Height 5\' 4"  5\' 4"  5\' 4"   Weight 138 lbs 142 lbs 129 lbs 3 oz  BMI 23.68 24.36 22.17  Systolic 138 122  Diastolic 83 76 71  Pulse 97 97 98    CONSTITUTIONAL: Age appropriate, walks w/ cane, hemodynamically stable, no acute distress.   SKIN: Skin is warm and dry. No rash noted. No cyanosis. No pallor. No jaundice HEAD: Normocephalic and atraumatic.  EYES: No scleral icterus MOUTH/THROAT: Moist oral membranes.  NECK: No JVD present. No thyromegaly noted. No carotid bruits  CHEST Normal respiratory effort. No intercostal retractions  LUNGS: Clear to auscultation bilaterally.  No stridor. No wheezes. No rales.  CARDIOVASCULAR: Irregularly irregular, variable S1-S2, no murmurs rubs or gallops appreciated secondary to tachycardia.   ABDOMINAL: Soft, nontender, nondistended, positive bowel sounds in all 4 quadrants, no apparent ascites.  EXTREMITIES: No peripheral edema, warm to touch HEMATOLOGIC: No significant bruising NEUROLOGIC: Oriented to person, place, and time. Nonfocal. Normal muscle tone.  PSYCHIATRIC: Normal mood and affect. Normal behavior. Cooperative.   CARDIAC DATABASE: EKG: 01/15/2022: Atrial fibrillation, 111 bpm. 02/26/2022: Atrial fibrillation, 118 bpm.  04/11/2022: Atrial fibrillation, 104 bpm, without underlying injury pattern.  Echocardiogram: 10/19/2021  1. Left ventricular ejection fraction, by estimation, is 60 to  65%. The left ventricle has normal function. The left ventricle has no regional wall motion abnormalities. Left ventricular diastolic parameters are indeterminate.   2. Right ventricular systolic function is normal. The right ventricular size is normal. Tricuspid regurgitation signal is inadequate for assessing PA pressure.   3. The mitral valve is normal in structure. Trivial mitral valve regurgitation. No evidence of mitral stenosis.   4. The aortic valve is tricuspid. Aortic valve regurgitation is mild.  Aortic valve sclerosis/calcification is present, without any evidence of aortic stenosis.   5. The patient is in atrial fibrillation.   Stress Testing: No results found for this or any previous visit from the past 1095 days.   Heart Catheterization: None  LABORATORY DATA:    Latest Ref Rng & Units 10/20/2021    1:46 AM 10/19/2021    5:00 AM 10/18/2021    1:12 PM  CBC  WBC 4.0 - 10.5 K/uL 10.1  15.6  25.5   Hemoglobin 12.0 - 15.0 g/dL 10/22/2021  10/21/2021  10/20/2021   Hematocrit 36.0 - 46.0 % 36.1  38.0  44.9   Platelets 150 - 400 K/uL 196  197  240        Latest Ref Rng & Units 10/22/2021    4:24 AM 10/21/2021    5:07 AM 10/20/2021    8:00 AM  CMP  Glucose 70 - 99 mg/dL 94  83    BUN 8 - 23 mg/dL 10  12    Creatinine 1.61 - 1.00 mg/dL 0.96  0.45    Sodium 409 - 145 mmol/L 135  136    Potassium 3.5 - 5.1 mmol/L 3.6  4.0    Chloride 98 - 111 mmol/L 101  103    CO2 22 - 32 mmol/L 28  27    Calcium 8.9 - 10.3 mg/dL 8.2  7.9    Total Protein 6.5 - 8.1 g/dL 5.5  5.7  5.6   Total Bilirubin 0.3 - 1.2 mg/dL 0.7  1.1  0.5   Alkaline Phos 38 - 126 U/L 40  43  39   AST 15 - 41 U/L 90  125  152   ALT 0 - 44 U/L 81  90  90     Lipid Panel  No results found for: "CHOL", "TRIG", "HDL", "CHOLHDL", "VLDL", "LDLCALC", "LDLDIRECT", "LABVLDL"  No components found for: "NTPROBNP" No results for input(s): "PROBNP" in the last 8760 hours. Recent Labs    10/18/21 1312  TSH 1.228    BMP Recent Labs     10/20/21 0146 10/21/21 0507 10/22/21 0424  NA 140 136 135  K 3.1* 4.0 3.6  CL 108 103 101  CO2 27 27 28   GLUCOSE 82 83 94  BUN 21 12 10   CREATININE 0.34* 0.44 0.51  CALCIUM 7.8* 7.9* 8.2*  GFRNONAA >60 >60 >60    HEMOGLOBIN A1C No results found for: "HGBA1C", "MPG"  IMPRESSION:    ICD-10-CM   1. Persistent atrial fibrillation (HCC)  I48.19 EKG 12-Lead    diltiazem (CARDIZEM CD) 300 MG 24 hr capsule    2. Long term (current) use of anticoagulants  Z79.01     3. Hypothyroidism, unspecified type  E03.9     4. Primary hypertension  I10        RECOMMENDATIONS: KADEJAH SANDIFORD is a 88 y.o. Caucasian  female whose past medical history and cardiac risk factors include: Hypertension, dyslipidemia, GERD, paroxysmal atrial fibrillation, hypothyroidism.   Persistent atrial fibrillation/long-term oral anticoagulation: EKG illustrates atrial fibrillation with better ventricular rate. Appears to be less symptomatic. Will increase Cardizem to 300 mg p.o. daily Continue Eliquis 5 mg p.o. twice daily Patient does not endorse evidence of bleeding. Patient would like to hold off on cardioversion at this time and reevaluate at the next office visit. CHA2DS2-VASc SCORE is 4 which correlates to 4 % risk of stroke per year (age, gender, HTN). Patient does not endorse evidence of bleeding. Risks, benefits, alternatives to anticoagulation discussed  Primary hypertension Office blood pressures are well controlled. Medications reconciled but no changes made. Currently managed by primary care provider.  Hypothyroidism, unspecified type Continue Synthroid. Currently managed by primary care provider.  I offered to discuss her care with the next of kin and patient informs me her son is in jail and does not want her brothers and sisters involved.  FINAL MEDICATION LIST END OF ENCOUNTER: Meds ordered this encounter  Medications   diltiazem (CARDIZEM CD) 300 MG 24 hr capsule    Sig: Take  1 capsule (300 mg total) by mouth every morning.    Dispense:  30 capsule    Refill:  0    Medications Discontinued During This Encounter  Medication Reason   diltiazem (CARDIZEM CD) 180 MG 24 hr capsule Dose change   DILT-XR 180 MG 24 hr capsule      Current Outpatient Medications:    acetaminophen (TYLENOL) 325 MG tablet, Take 650 mg by mouth in the morning and at bedtime., Disp: , Rfl:    alendronate (FOSAMAX) 70 MG tablet, Take 70 mg by mouth every Monday. Take with a full glass of water on an empty stomach., Disp: , Rfl:    Cholecalciferol (VITAMIN D3) 50 MCG (2000 UT) TABS, Take 1 tablet by mouth daily., Disp: , Rfl:    diltiazem (CARDIZEM CD) 300 MG 24 hr capsule, Take 1 capsule (300 mg total) by mouth every morning., Disp: 30 capsule, Rfl: 0   ELIQUIS 5 MG TABS tablet, Take 5 mg by mouth 2 (two) times daily., Disp: , Rfl:    hydrochlorothiazide (HYDRODIURIL) 25 MG tablet, Take 25 mg by mouth in the morning., Disp: , Rfl:    levothyroxine (SYNTHROID) 88 MCG tablet, Take 88 mcg by mouth daily before breakfast., Disp: , Rfl:    Oyster Shell 500 MG TABS, Take 500 mg by mouth daily., Disp: , Rfl:    potassium chloride SA (KLOR-CON M) 20 MEQ tablet, Take 20 mEq by mouth daily with breakfast., Disp: , Rfl:    pyridoxine (B-6) 100 MG tablet, Take 100 mg by mouth daily., Disp: , Rfl:    sertraline (ZOLOFT) 50 MG tablet, Take 50 mg by mouth daily., Disp: , Rfl:    vitamin C (ASCORBIC ACID) 500 MG tablet, Take 500 mg by mouth daily., Disp: , Rfl:   Orders Placed This Encounter  Procedures   EKG 12-Lead    There are no Patient Instructions on file for this visit.   --Continue cardiac medications as reconciled in final medication list. --Return in about 2 months (around 06/17/2022) for Follow up, A. fib, EKG on arrival.. Or sooner if needed. --Continue follow-up with your primary care physician regarding the management of your other chronic comorbid conditions.  Patient's questions and  concerns were addressed to her satisfaction. She voices understanding of the instructions provided during this encounter.   This note was created using a voice recognition software as a result there may be grammatical errors inadvertently enclosed that do not reflect the nature of this encounter. Every attempt is made to correct such errors.  Total time spent: 30 minutes.  Tessa Lerner, [place], [facility]  Pager: [phone] Office: [phone]

## 2022-04-26 ENCOUNTER — Ambulatory Visit: Payer: Self-pay

## 2022-04-26 NOTE — Patient Outreach (Signed)
  Care Coordination   04/26/2022 Name: ENGLISH TOMER MRN: [id number] DOB: September 13, 1933   Care Coordination Outreach Attempts:  An unsuccessful telephone outreach was attempted today to offer the patient information about available care coordination services as a benefit of their health plan.   Follow Up Plan:  Additional outreach attempts will be made to offer the patient care coordination information and services.   Encounter Outcome:  No Answer  Care Coordination Interventions Activated:  No   Care Coordination Interventions:  No, not indicated    [facility] Care Management [phone]

## 2022-05-08 ENCOUNTER — Other Ambulatory Visit: Payer: Self-pay | Admitting: Cardiology

## 2022-05-08 ENCOUNTER — Encounter (HOSPITAL_COMMUNITY): Payer: Self-pay

## 2022-05-08 ENCOUNTER — Emergency Department (HOSPITAL_COMMUNITY): Payer: Medicare Other

## 2022-05-08 ENCOUNTER — Inpatient Hospital Stay (HOSPITAL_COMMUNITY)
Admission: EM | Admit: 2022-05-08 | Discharge: 2022-05-16 | DRG: 964 | Disposition: A | Discharge status: Skilled Nursing Facility | Payer: Medicare Other | Attending: Surgery | Admitting: Surgery

## 2022-05-08 ENCOUNTER — Other Ambulatory Visit: Payer: Self-pay

## 2022-05-08 DIAGNOSIS — D62 Acute posthemorrhagic anemia: Secondary | ICD-10-CM | POA: Diagnosis not present

## 2022-05-08 DIAGNOSIS — Z881 Allergy status to other antibiotic agents status: Secondary | ICD-10-CM | POA: Diagnosis not present

## 2022-05-08 DIAGNOSIS — Z888 Allergy status to other drugs, medicaments and biological substances status: Secondary | ICD-10-CM

## 2022-05-08 DIAGNOSIS — W19XXXA Unspecified fall, initial encounter: Secondary | ICD-10-CM | POA: Diagnosis not present

## 2022-05-08 DIAGNOSIS — S3282XA Multiple fractures of pelvis without disruption of pelvic ring, initial encounter for closed fracture: Secondary | ICD-10-CM | POA: Diagnosis not present

## 2022-05-08 DIAGNOSIS — Z8249 Family history of ischemic heart disease and other diseases of the circulatory system: Secondary | ICD-10-CM | POA: Diagnosis not present

## 2022-05-08 DIAGNOSIS — D6832 Hemorrhagic disorder due to extrinsic circulating anticoagulants: Secondary | ICD-10-CM | POA: Diagnosis present

## 2022-05-08 DIAGNOSIS — R2689 Other abnormalities of gait and mobility: Secondary | ICD-10-CM | POA: Diagnosis not present

## 2022-05-08 DIAGNOSIS — S51012A Laceration without foreign body of left elbow, initial encounter: Secondary | ICD-10-CM | POA: Diagnosis present

## 2022-05-08 DIAGNOSIS — S2243XA Multiple fractures of ribs, bilateral, initial encounter for closed fracture: Secondary | ICD-10-CM | POA: Diagnosis not present

## 2022-05-08 DIAGNOSIS — R2681 Unsteadiness on feet: Secondary | ICD-10-CM | POA: Diagnosis not present

## 2022-05-08 DIAGNOSIS — Z9181 History of falling: Secondary | ICD-10-CM | POA: Diagnosis not present

## 2022-05-08 DIAGNOSIS — M47816 Spondylosis without myelopathy or radiculopathy, lumbar region: Secondary | ICD-10-CM | POA: Diagnosis not present

## 2022-05-08 DIAGNOSIS — E876 Hypokalemia: Secondary | ICD-10-CM | POA: Diagnosis not present

## 2022-05-08 DIAGNOSIS — R519 Headache, unspecified: Secondary | ICD-10-CM | POA: Diagnosis not present

## 2022-05-08 DIAGNOSIS — M7989 Other specified soft tissue disorders: Secondary | ICD-10-CM | POA: Diagnosis not present

## 2022-05-08 DIAGNOSIS — W19XXXD Unspecified fall, subsequent encounter: Secondary | ICD-10-CM

## 2022-05-08 DIAGNOSIS — Z20822 Contact with and (suspected) exposure to covid-19: Secondary | ICD-10-CM | POA: Diagnosis not present

## 2022-05-08 DIAGNOSIS — J8410 Pulmonary fibrosis, unspecified: Secondary | ICD-10-CM | POA: Diagnosis not present

## 2022-05-08 DIAGNOSIS — Z743 Need for continuous supervision: Secondary | ICD-10-CM | POA: Diagnosis not present

## 2022-05-08 DIAGNOSIS — I10 Essential (primary) hypertension: Secondary | ICD-10-CM | POA: Diagnosis not present

## 2022-05-08 DIAGNOSIS — M6281 Muscle weakness (generalized): Secondary | ICD-10-CM | POA: Diagnosis not present

## 2022-05-08 DIAGNOSIS — Z7901 Long term (current) use of anticoagulants: Secondary | ICD-10-CM

## 2022-05-08 DIAGNOSIS — I4819 Other persistent atrial fibrillation: Secondary | ICD-10-CM

## 2022-05-08 DIAGNOSIS — Z79899 Other long term (current) drug therapy: Secondary | ICD-10-CM | POA: Diagnosis not present

## 2022-05-08 DIAGNOSIS — D7389 Other diseases of spleen: Secondary | ICD-10-CM | POA: Diagnosis not present

## 2022-05-08 DIAGNOSIS — R1312 Dysphagia, oropharyngeal phase: Secondary | ICD-10-CM | POA: Diagnosis not present

## 2022-05-08 DIAGNOSIS — S32592A Other specified fracture of left pubis, initial encounter for closed fracture: Secondary | ICD-10-CM | POA: Diagnosis not present

## 2022-05-08 DIAGNOSIS — Z886 Allergy status to analgesic agent status: Secondary | ICD-10-CM

## 2022-05-08 DIAGNOSIS — S32810A Multiple fractures of pelvis with stable disruption of pelvic ring, initial encounter for closed fracture: Secondary | ICD-10-CM | POA: Diagnosis not present

## 2022-05-08 DIAGNOSIS — G8929 Other chronic pain: Secondary | ICD-10-CM | POA: Diagnosis not present

## 2022-05-08 DIAGNOSIS — S2242XA Multiple fractures of ribs, left side, initial encounter for closed fracture: Secondary | ICD-10-CM | POA: Diagnosis not present

## 2022-05-08 DIAGNOSIS — S32512D Fracture of superior rim of left pubis, subsequent encounter for fracture with routine healing: Secondary | ICD-10-CM | POA: Diagnosis not present

## 2022-05-08 DIAGNOSIS — E785 Hyperlipidemia, unspecified: Secondary | ICD-10-CM | POA: Diagnosis not present

## 2022-05-08 DIAGNOSIS — W109XXA Fall (on) (from) unspecified stairs and steps, initial encounter: Secondary | ICD-10-CM | POA: Diagnosis present

## 2022-05-08 DIAGNOSIS — R58 Hemorrhage, not elsewhere classified: Secondary | ICD-10-CM

## 2022-05-08 DIAGNOSIS — E039 Hypothyroidism, unspecified: Secondary | ICD-10-CM | POA: Diagnosis not present

## 2022-05-08 DIAGNOSIS — I4891 Unspecified atrial fibrillation: Secondary | ICD-10-CM | POA: Diagnosis present

## 2022-05-08 DIAGNOSIS — Z79890 Hormone replacement therapy (postmenopausal): Secondary | ICD-10-CM

## 2022-05-08 DIAGNOSIS — S3282XD Multiple fractures of pelvis without disruption of pelvic ring, subsequent encounter for fracture with routine healing: Secondary | ICD-10-CM

## 2022-05-08 DIAGNOSIS — M542 Cervicalgia: Secondary | ICD-10-CM | POA: Diagnosis not present

## 2022-05-08 DIAGNOSIS — Z23 Encounter for immunization: Secondary | ICD-10-CM

## 2022-05-08 DIAGNOSIS — R600 Localized edema: Secondary | ICD-10-CM | POA: Diagnosis not present

## 2022-05-08 DIAGNOSIS — Z87891 Personal history of nicotine dependence: Secondary | ICD-10-CM | POA: Diagnosis not present

## 2022-05-08 DIAGNOSIS — S32512A Fracture of superior rim of left pubis, initial encounter for closed fracture: Secondary | ICD-10-CM | POA: Diagnosis not present

## 2022-05-08 DIAGNOSIS — R188 Other ascites: Secondary | ICD-10-CM | POA: Diagnosis not present

## 2022-05-08 DIAGNOSIS — Y92009 Unspecified place in unspecified non-institutional (private) residence as the place of occurrence of the external cause: Secondary | ICD-10-CM

## 2022-05-08 DIAGNOSIS — Z90710 Acquired absence of both cervix and uterus: Secondary | ICD-10-CM | POA: Diagnosis not present

## 2022-05-08 DIAGNOSIS — S301XXA Contusion of abdominal wall, initial encounter: Secondary | ICD-10-CM | POA: Diagnosis not present

## 2022-05-08 DIAGNOSIS — J432 Centrilobular emphysema: Secondary | ICD-10-CM | POA: Diagnosis not present

## 2022-05-08 DIAGNOSIS — S36892A Contusion of other intra-abdominal organs, initial encounter: Secondary | ICD-10-CM | POA: Diagnosis not present

## 2022-05-08 DIAGNOSIS — F32A Depression, unspecified: Secondary | ICD-10-CM | POA: Diagnosis not present

## 2022-05-08 DIAGNOSIS — Z7983 Long term (current) use of bisphosphonates: Secondary | ICD-10-CM | POA: Diagnosis not present

## 2022-05-08 DIAGNOSIS — R911 Solitary pulmonary nodule: Secondary | ICD-10-CM | POA: Diagnosis not present

## 2022-05-08 DIAGNOSIS — S79912A Unspecified injury of left hip, initial encounter: Secondary | ICD-10-CM | POA: Diagnosis not present

## 2022-05-08 DIAGNOSIS — I700 Atherosclerosis of aorta: Secondary | ICD-10-CM | POA: Diagnosis not present

## 2022-05-08 LAB — BASIC METABOLIC PANEL
Anion gap: 11 (ref 5–15)
BUN: 15 mg/dL (ref 8–23)
CO2: 29 mmol/L (ref 22–32)
Calcium: 9.6 mg/dL (ref 8.9–10.3)
Chloride: 99 mmol/L (ref 98–111)
Creatinine, Ser: 0.88 mg/dL (ref 0.44–1.00)
GFR, Estimated: >60 mL/min (ref >60)
Glucose, Bld: 170 mg/dL — ABNORMAL HIGH (ref 70–99)
Potassium: 3.5 mmol/L (ref 3.5–5.1)
Sodium: 139 mmol/L (ref 135–145)

## 2022-05-08 LAB — CBC WITH DIFFERENTIAL/PLATELET
Abs Immature Granulocytes: 0.15 10*3/uL — ABNORMAL HIGH (ref 0.00–0.07)
Basophils Absolute: 0 10*3/uL (ref 0.0–0.1)
Basophils Relative: 0 %
Eosinophils Absolute: 0 10*3/uL (ref 0.0–0.5)
Eosinophils Relative: 0 %
HCT: 34.6 % — ABNORMAL LOW (ref 36.0–46.0)
Hemoglobin: 11 g/dL — ABNORMAL LOW (ref 12.0–15.0)
Immature Granulocytes: 1 %
Lymphocytes Relative: 4 %
Lymphs Abs: 0.6 10*3/uL — ABNORMAL LOW (ref 0.7–4.0)
MCH: 28 pg (ref 26.0–34.0)
MCHC: 31.8 g/dL (ref 30.0–36.0)
MCV: 88 fL (ref 80.0–100.0)
Monocytes Absolute: 1.3 10*3/uL — ABNORMAL HIGH (ref 0.1–1.0)
Monocytes Relative: 8 %
Neutro Abs: 15.2 10*3/uL — ABNORMAL HIGH (ref 1.7–7.7)
Neutrophils Relative %: 87 %
Platelets: 193 10*3/uL (ref 150–400)
RBC: 3.93 MIL/uL (ref 3.87–5.11)
RDW: 15.1 % (ref 11.5–15.5)
WBC: 17.3 10*3/uL — ABNORMAL HIGH (ref 4.0–10.5)
nRBC: 0 % (ref 0.0–0.2)

## 2022-05-08 LAB — SAMPLE TO BLOOD BANK

## 2022-05-08 LAB — I-STAT CHEM 8, ED
BUN: 12 mg/dL (ref 8–23)
Calcium, Ion: 1.11 mmol/L — ABNORMAL LOW (ref 1.15–1.40)
Chloride: 94 mmol/L — ABNORMAL LOW (ref 98–111)
Creatinine, Ser: 0.7 mg/dL (ref 0.44–1.00)
Glucose, Bld: 133 mg/dL — ABNORMAL HIGH (ref 70–99)
HCT: 28 % — ABNORMAL LOW (ref 36.0–46.0)
Hemoglobin: 9.5 g/dL — ABNORMAL LOW (ref 12.0–15.0)
Potassium: 2.9 mmol/L — ABNORMAL LOW (ref 3.5–5.1)
Sodium: 136 mmol/L (ref 135–145)
TCO2: 29 mmol/L (ref 22–32)

## 2022-05-08 LAB — URINALYSIS, ROUTINE W REFLEX MICROSCOPIC
Bilirubin Urine: NEGATIVE
Glucose, UA: NEGATIVE mg/dL
Hgb urine dipstick: NEGATIVE
Ketones, ur: NEGATIVE mg/dL
Leukocytes,Ua: NEGATIVE
Nitrite: NEGATIVE
Protein, ur: NEGATIVE mg/dL
Specific Gravity, Urine: <=1.005 (ref 1.005–1.030)
pH: 6 (ref 5.0–8.0)

## 2022-05-08 LAB — HEPATIC FUNCTION PANEL
ALT: 25 U/L (ref 0–44)
AST: 52 U/L — ABNORMAL HIGH (ref 15–41)
Albumin: 4.1 g/dL (ref 3.5–5.0)
Alkaline Phosphatase: 60 U/L (ref 38–126)
Bilirubin, Direct: 0.2 mg/dL (ref 0.0–0.2)
Indirect Bilirubin: 0.9 mg/dL (ref 0.3–0.9)
Total Bilirubin: 1.1 mg/dL (ref 0.3–1.2)
Total Protein: 7.6 g/dL (ref 6.5–8.1)

## 2022-05-08 LAB — RESP PANEL BY RT-PCR (FLU A&B, COVID) ARPGX2
Influenza A by PCR: NEGATIVE
Influenza B by PCR: NEGATIVE
SARS Coronavirus 2 by RT PCR: NEGATIVE

## 2022-05-08 LAB — CBC
HCT: 26.8 % — ABNORMAL LOW (ref 36.0–46.0)
Hemoglobin: 8.8 g/dL — ABNORMAL LOW (ref 12.0–15.0)
MCH: 28.3 pg (ref 26.0–34.0)
MCHC: 32.8 g/dL (ref 30.0–36.0)
MCV: 86.2 fL (ref 80.0–100.0)
Platelets: 179 10*3/uL (ref 150–400)
RBC: 3.11 MIL/uL — ABNORMAL LOW (ref 3.87–5.11)
RDW: 15.3 % (ref 11.5–15.5)
WBC: 12.7 10*3/uL — ABNORMAL HIGH (ref 4.0–10.5)
nRBC: 0 % (ref 0.0–0.2)

## 2022-05-08 LAB — PROTIME-INR
INR: 1.3 — ABNORMAL HIGH (ref 0.8–1.2)
Prothrombin Time: 16 seconds — ABNORMAL HIGH (ref 11.4–15.2)

## 2022-05-08 LAB — CK: Total CK: 323 U/L — ABNORMAL HIGH (ref 38–234)

## 2022-05-08 LAB — LACTIC ACID, PLASMA: Lactic Acid, Venous: 3.2 mmol/L (ref 0.5–1.9)

## 2022-05-08 LAB — ETHANOL: Alcohol, Ethyl (B): <10 mg/dL (ref <10)

## 2022-05-08 MED ORDER — PANTOPRAZOLE SODIUM 40 MG PO TBEC
40.0000 mg | DELAYED_RELEASE_TABLET | Freq: Every day | ORAL | Status: DC
  Administered 2022-05-09: 40 mg via ORAL
  Filled 2022-05-08 (×2): qty 1

## 2022-05-08 MED ORDER — BACITRACIN ZINC 500 UNIT/GM EX OINT
TOPICAL_OINTMENT | Freq: Two times a day (BID) | CUTANEOUS | Status: DC
  Administered 2022-05-08 – 2022-05-13 (×6): 1 via TOPICAL
  Filled 2022-05-08: qty 0.9
  Filled 2022-05-08: qty 28.4
  Filled 2022-05-08: qty 0.9

## 2022-05-08 MED ORDER — IOHEXOL 300 MG/ML  SOLN
100.0000 mL | Freq: Once | INTRAMUSCULAR | Status: AC | PRN
  Administered 2022-05-08: 100 mL via INTRAVENOUS

## 2022-05-08 MED ORDER — ONDANSETRON 4 MG PO TBDP: 4.0000 mg | ORAL_TABLET | Freq: Four times a day (QID) | ORAL | Status: DC | PRN

## 2022-05-08 MED ORDER — FENTANYL CITRATE PF 50 MCG/ML IJ SOSY
25.0000 ug | PREFILLED_SYRINGE | Freq: Once | INTRAMUSCULAR | Status: AC
  Administered 2022-05-08: 25 ug via INTRAVENOUS
  Filled 2022-05-08: qty 1

## 2022-05-08 MED ORDER — HYDROCHLOROTHIAZIDE 25 MG PO TABS
25.0000 mg | ORAL_TABLET | Freq: Every morning | ORAL | Status: DC
  Administered 2022-05-11 – 2022-05-16 (×6): 25 mg via ORAL
  Filled 2022-05-08 (×6): qty 1

## 2022-05-08 MED ORDER — DOCUSATE SODIUM 100 MG PO CAPS
100.0000 mg | ORAL_CAPSULE | Freq: Two times a day (BID) | ORAL | Status: DC
  Administered 2022-05-08 – 2022-05-16 (×14): 100 mg via ORAL
  Filled 2022-05-08 (×15): qty 1

## 2022-05-08 MED ORDER — SERTRALINE HCL 50 MG PO TABS
50.0000 mg | ORAL_TABLET | Freq: Every day | ORAL | Status: DC
  Administered 2022-05-08 – 2022-05-16 (×9): 50 mg via ORAL
  Filled 2022-05-08 (×9): qty 1

## 2022-05-08 MED ORDER — POLYETHYLENE GLYCOL 3350 17 G PO PACK
17.0000 g | PACK | Freq: Every day | ORAL | Status: DC | PRN
  Administered 2022-05-09: 17 g via ORAL
  Filled 2022-05-08: qty 1

## 2022-05-08 MED ORDER — OXYCODONE HCL 5 MG PO TABS
2.5000 mg | ORAL_TABLET | ORAL | Status: DC | PRN
  Administered 2022-05-08 (×2): 5 mg via ORAL
  Administered 2022-05-09 – 2022-05-10 (×2): 2.5 mg via ORAL
  Administered 2022-05-10 – 2022-05-16 (×9): 5 mg via ORAL
  Filled 2022-05-08 (×16): qty 1

## 2022-05-08 MED ORDER — ACETAMINOPHEN 325 MG PO TABS
650.0000 mg | ORAL_TABLET | Freq: Four times a day (QID) | ORAL | Status: DC
  Administered 2022-05-08 – 2022-05-10 (×5): 650 mg via ORAL
  Filled 2022-05-08 (×5): qty 2

## 2022-05-08 MED ORDER — PROCHLORPERAZINE MALEATE 10 MG PO TABS: 10.0000 mg | ORAL_TABLET | Freq: Four times a day (QID) | ORAL | Status: DC | PRN

## 2022-05-08 MED ORDER — METOPROLOL TARTRATE 5 MG/5ML IV SOLN: 5.0000 mg | Freq: Four times a day (QID) | INTRAVENOUS | Status: DC | PRN

## 2022-05-08 MED ORDER — METHOCARBAMOL 500 MG PO TABS: 500.0000 mg | ORAL_TABLET | Freq: Three times a day (TID) | ORAL | Status: DC | PRN

## 2022-05-08 MED ORDER — LEVOTHYROXINE SODIUM 88 MCG PO TABS
88.0000 ug | ORAL_TABLET | Freq: Every day | ORAL | Status: DC
  Administered 2022-05-09 – 2022-05-16 (×8): 88 ug via ORAL
  Filled 2022-05-08 (×8): qty 1

## 2022-05-08 MED ORDER — METHOCARBAMOL 1000 MG/10ML IJ SOLN
500.0000 mg | Freq: Three times a day (TID) | INTRAVENOUS | Status: DC | PRN
  Filled 2022-05-08: qty 5

## 2022-05-08 MED ORDER — DILTIAZEM HCL ER COATED BEADS 300 MG PO CP24
300.0000 mg | ORAL_CAPSULE | Freq: Every evening | ORAL | Status: DC
  Administered 2022-05-08 – 2022-05-15 (×8): 300 mg via ORAL
  Filled 2022-05-08 (×10): qty 1

## 2022-05-08 MED ORDER — SODIUM CHLORIDE 0.9 % IV SOLN: INTRAVENOUS | Status: DC

## 2022-05-08 MED ORDER — PROCHLORPERAZINE EDISYLATE 10 MG/2ML IJ SOLN: 5.0000 mg | Freq: Four times a day (QID) | INTRAMUSCULAR | Status: DC | PRN

## 2022-05-08 MED ORDER — PNEUMOCOCCAL 20-VAL CONJ VACC 0.5 ML IM SUSY
0.5000 mL | PREFILLED_SYRINGE | INTRAMUSCULAR | Status: AC | PRN
  Administered 2022-05-10: 0.5 mL via INTRAMUSCULAR
  Filled 2022-05-08: qty 0.5

## 2022-05-08 MED ORDER — ONDANSETRON HCL 4 MG/2ML IJ SOLN
4.0000 mg | Freq: Four times a day (QID) | INTRAMUSCULAR | Status: DC | PRN
  Administered 2022-05-09: 4 mg via INTRAVENOUS
  Filled 2022-05-08: qty 2

## 2022-05-08 MED ORDER — PROTHROMBIN COMPLEX CONC HUMAN 500 UNITS IV KIT
3171.0000 [IU] | PACK | Status: AC
  Administered 2022-05-08: 3171 [IU] via INTRAVENOUS
  Filled 2022-05-08: qty 3171

## 2022-05-08 MED ORDER — INFLUENZA VAC A&B SA ADJ QUAD 0.5 ML IM PRSY
0.5000 mL | PREFILLED_SYRINGE | INTRAMUSCULAR | Status: AC | PRN
  Administered 2022-05-10: 0.5 mL via INTRAMUSCULAR
  Filled 2022-05-08: qty 0.5

## 2022-05-08 MED ORDER — SODIUM CHLORIDE 0.9 % IV BOLUS
500.0000 mL | Freq: Once | INTRAVENOUS | Status: AC
  Administered 2022-05-08: 500 mL via INTRAVENOUS

## 2022-05-08 MED ORDER — FENTANYL CITRATE PF 50 MCG/ML IJ SOSY: 12.5000 ug | PREFILLED_SYRINGE | INTRAMUSCULAR | Status: DC | PRN

## 2022-05-08 MED ORDER — PANTOPRAZOLE SODIUM 40 MG IV SOLR
40.0000 mg | Freq: Every day | INTRAVENOUS | Status: DC
  Administered 2022-05-08: 40 mg via INTRAVENOUS
  Filled 2022-05-08: qty 10

## 2022-05-08 MED ORDER — MUPIROCIN 2 % EX OINT
1.0000 | TOPICAL_OINTMENT | Freq: Two times a day (BID) | CUTANEOUS | Status: AC
  Administered 2022-05-09 – 2022-05-13 (×10): 1 via NASAL
  Filled 2022-05-08 (×4): qty 22

## 2022-05-08 NOTE — Progress Notes (Addendum)
Trauma Event Note  TRN to bedside to round on new transfer pt from [facility] ED, pt arrives via [facility] after a fall down 2-3 steps this morning around 0600. States she was going to feed her cats outside, difficulty bearing weight at home and didn't arrive to ED for 6 hours. Pubic rami and pubic symphysis fx, large hematoma with extrav at pubic symphysis on ED workup. Concern for issues with bladder emptying - plan for foley per Dr. Derrell Lolling, who is at bedside. If urine output is bloody, pt needs a CT cystogram per Dr. Derrell Lolling. Q6H CBC trends. Reports no pain upon arrival. CAGE AID completed. Ortho c/s, therapies in AM.   Last imported Vital Signs BP 106/68 (BP Location: Left Arm)   Pulse (!) 123   Temp 97.7 F (36.5 C) (Oral)   Resp 16   Ht 5\' 4"  (1.626 m)   Wt 138 lb 14.2 oz (63 kg)   SpO2 97%   BMI 23.84 kg/m   Trending CBC Recent Labs    05/08/22 1324 05/08/22 1711  WBC 17.3*  --   HGB 11.0* 9.5*  HCT 34.6* 28.0*  PLT 193  --     Trending Coag's Recent Labs    05/08/22 1620  INR 1.3*    Trending BMET Recent Labs    05/08/22 1324 05/08/22 1711  NA 139 136  K 3.5 2.9*  CL 99 94*  CO2 29  --   BUN 15 12  CREATININE 0.88 0.70  GLUCOSE 170* 133*      Latresha Yahr O Taeden Geller  Trauma Response RN  Please call TRN at [phone] for further assistance.

## 2022-05-08 NOTE — ED Triage Notes (Addendum)
Pt arriving from home via EMS following a fall at 6am while trying to go outside to feed her cats. Did not hit her head but is on blood thinners. Pt reports left hip pain, has difficulty bearing weight at this time. No shortening noted.

## 2022-05-08 NOTE — Progress Notes (Signed)
Patient arrived to [facility] 6M60 from [facility] ED. Patient is GCS 15, on room air, no distress. Oliver Barre, RN

## 2022-05-08 NOTE — Progress Notes (Signed)
Trauma Note  Patient to [facility] by EMS after a fall at home down 2-3 stairs. She has afib and is on Eliquis. Imaging revealed pelvic fxs with pelvic hematoma with mass effect on bladder. Helped facilitate transfer to [facility] 4NICU for serial monitoring of HGB. Kcentra given for eliquis reversal. [facility] and [facility] CRN notified of transfer.  Jill Side Zephan Beauchaine  Trauma Response RN  Please call TRN at [phone] for further assistance.

## 2022-05-08 NOTE — Consult Note (Signed)
I have reviewed her imaging. Likely pelvic fractures are stable. I will evaluate and ask Dr. Starr Sinclair to review CT as well. Will address WB status after their review. Formal consult to follow.    Sheral Apley

## 2022-05-08 NOTE — ED Provider Notes (Signed)
[facility]-EMERGENCY DEPT Provider Note   CSN: [id number] Arrival date & time: 05/08/22  1151     History  Chief Complaint  Patient presents with   Maria [name] is a 88 y.o. female.  HPI Patient presents with hip pain.  She arrives via EMS and history is obtained by those individuals and the patient herself.  Patient is a former Engineer, civil (consulting).  She notes that she fell earlier in the day, falling onto a concrete floor striking her left hip.  No head trauma, neck palm, shoulder pain.  EMS staff notes that the patient fell 6 hours prior to ED arrival.  While she was briefly able to bear weight, after sitting down she was subsequently unable to bear weight on the left leg, prompting their notification.  EMS notes no hemodynamic instability in route.    Home Medications Prior to Admission medications   Medication Sig Start Date End Date Taking? Authorizing Provider  acetaminophen (TYLENOL) 325 MG tablet Take 650 mg by mouth in the morning and at bedtime.    [provider]  alendronate (FOSAMAX) 70 MG tablet Take 70 mg by mouth every Monday. Take with a full glass of water on an empty stomach.    [provider]  Cholecalciferol (VITAMIN D3) 50 MCG (2000 UT) TABS Take 1 tablet by mouth daily.    [provider]  diltiazem (CARDIZEM CD) 300 MG 24 hr capsule TAKE ONE CAPSULE BY MOUTH EVERY EVENING 05/08/22   Tolia, Sunit, DO  ELIQUIS 5 MG TABS tablet Take 5 mg by mouth 2 (two) times daily. 12/06/21   [provider]  hydrochlorothiazide (HYDRODIURIL) 25 MG tablet Take 25 mg by mouth in the morning.    [provider]  levothyroxine (SYNTHROID) 88 MCG tablet Take 88 mcg by mouth daily before breakfast.    [provider]  Oyster Shell 500 MG TABS Take 500 mg by mouth daily.    [provider]  potassium chloride SA (KLOR-CON M) 20 MEQ tablet Take 20 mEq by mouth daily with breakfast.    [provider]   pyridoxine (B-6) 100 MG tablet Take 100 mg by mouth daily.    [provider]  sertraline (ZOLOFT) 50 MG tablet Take 50 mg by mouth daily.    [provider]  vitamin C (ASCORBIC ACID) 500 MG tablet Take 500 mg by mouth daily.    [provider]      Allergies    Clindamycin, Diclofenac sodium, and Nsaids    Review of Systems   Review of Systems  All other systems reviewed and are negative.   Physical Exam Updated Vital Signs BP 115/73 (BP Location: Left Arm)   Pulse (!) 111   Temp 97.6 F (36.4 C) (Oral)   Resp 18   SpO2 96%  Physical Exam Vitals and nursing note reviewed.  Constitutional:      General: She is not in acute distress.    Appearance: She is well-developed.  HENT:     Head: Normocephalic and atraumatic.  Eyes:     Conjunctiva/sclera: Conjunctivae normal.  Neck:     Comments: Atraumatic, full range of motion, no pain Cardiovascular:     Rate and Rhythm: Normal rate and regular rhythm.  Pulmonary:     Effort: Pulmonary effort is normal. No respiratory distress.     Breath sounds: Normal breath sounds. No stridor.  Abdominal:     General: There is no  distension.  Musculoskeletal:       Legs:  Skin:    General: Skin is warm and dry.  Neurological:     Mental Status: She is alert and oriented to person, place, and time.     Cranial Nerves: No cranial nerve deficit.  Psychiatric:        Mood and Affect: Mood normal.     ED Results / Procedures / Treatments   Labs (all labs ordered are listed, but only abnormal results are displayed) Labs Reviewed - No data to display  EKG None  Radiology DG Hip Unilat With Pelvis 2-3 Views Left  Result Date: 05/08/2022 CLINICAL DATA:  Trauma, fall EXAM: DG HIP (WITH OR WITHOUT PELVIS) 2-3V LEFT COMPARISON:  None Available. FINDINGS: No displaced fracture or dislocation is seen. There is faint transverse sclerosis in subcapital portion of neck of left femur. There is questionable  minimal offset in alignment of cortical margin along the medial aspect of subcapital portion of neck. Degenerative changes are noted in visualized lower lumbar spine and SI joints. There is soft tissue swelling lateral to the left hip. IMPRESSION: No displaced fracture or dislocation is seen. There is faint transverse sclerosis in the subcapital portion of neck of left femur which may be an artifact due to confluence of posterior margin of the acetabulum or suggest impacted fracture. If clinically warranted, follow-up CT may be considered. Lumbar spondylosis. Electronically Signed   By: Ernie Avena M.D.   On: 05/08/2022 13:01    Procedures Procedures    Medications Ordered in ED Medications - No data to display  ED Course/ Medical Decision Making/ A&P This patient with a Hx of A-fib, on anticoagulation presents to the ED for concern of pain in her left hip following a fall, this involves an extensive number of treatment options, and is a complaint that carries with it a high risk of complications and morbidity.    The differential diagnosis includes fracture., Soft tissue injury, less likely intracranial abnormality given the passage of hours after the initial fall and arrival here, though this is a consideration   Social Determinants of Health:  No limiting factors  Additional history obtained:  Additional history and/or information obtained from EMS, notable for details of fall, as well as transport vitals which were unremarkable   After the initial evaluation, orders, including: Chest and pelvis x-ray, labs were initiated.   Patient placed on Cardiac and Pulse-Oximetry Monitors. The patient was maintained on a cardiac monitor.  The cardiac monitored showed an rhythm of 110 A-fib abnormal The patient was also maintained on pulse oximetry. The readings were typically 99% room air normal   On repeat evaluation of the patient stayed the same  Lab Tests:  I personally  interpreted labs.  The pertinent results include: Anemia, new compared to several months ago  Imaging Studies ordered:  I independently visualized and interpreted imaging which showed initial x-ray reviewed, not obvious for fracture, with her history of fall, difficulty moving the leg, repeat CT was ordered subsequently, this was demonstrative of multiple pelvic rami fracture with hematoma and CT with and without contrast was ordered.  Review of this demonstrates hematoma, with possible x-ray of, discussed this myself with the radiologist and with our trauma team, Dr. Derrell Lolling subsequently discussed this with our interventional radiology team, and this bleed not an indication for emergent IR procedure. I agree with the radiologist interpretation  Consultations Obtained:  I requested consultation with the interventional radiology, trauma,  and discussed  lab and imaging findings as well as pertinent plan - they recommend: Transfer to our tertiary trauma center, reversal of patient's anticoagulation.  Dispostion / Final MDM:  After consideration of the diagnostic results and the patient's response to treatment, this adult female on anticoagulation for history of A-fib presents with pain in her left hip following a fall.  Patient is distally neurovascularly intact, is awake, alert, has no hypotension.  However, the patient is found to have new anemia and CT evidence for hematoma in the pelvis with active arterial extravasation as well as multiple pelvic rami fracture. Ortho consulted, without return call, but case discussed at length with our trauma team, particular given the patient's hematoma, anemia, anticoagulation.  As above indication for reversal of the patient's anticoagulation this was facilitated, case discussed with IR, patient not a candidate for procedure, but did require transfer to our affiliated care center, as above.  Final Clinical Impression(s) / ED Diagnoses Final diagnoses:  Fall,  initial encounter  Hemorrhage of pelvic artery  Multiple closed fractures of pelvis without disruption of pelvic ring, initial encounter (HCC)   CRITICAL CARE Performed by: Gerhard Munch Total critical care time: 50 minutes Critical care time was exclusive of separately billable procedures and treating other patients. Critical care was necessary to treat or prevent imminent or life-threatening deterioration. Critical care was time spent personally by me on the following activities: development of treatment plan with patient and/or surrogate as well as nursing, discussions with consultants, evaluation of patient's response to treatment, examination of patient, obtaining history from patient or surrogate, ordering and performing treatments and interventions, ordering and review of laboratory studies, ordering and review of radiographic studies, pulse oximetry and re-evaluation of patient's condition.    Gerhard Munch, MD 05/08/22 [phone]

## 2022-05-08 NOTE — H&P (Signed)
[facility] Admission Note  Maria [name] [facility] 1934-08-11  [id number].    Requesting MD: Gerhard Munch Chief Complaint/Reason for Consult: fall  HPI:  Maria [name] is a 88 y.o. female PMH A fib on eliquis (last dose 9/5 in PM), HTN, hypothyroidism who presented to [facility] today after suffering a fall this morning. She reports falling down 1-3 stairs and landing on her left hip on concrete. She did not hit her head or have LOC. Unable to ambulate so she pushed her life alert and EMS brought her to [facility]. Only complaining of left hip and lower back pain.  In the ED she has been intermittently tachycardic up to 120s, otherwise VSS. Initial imaging with xray and CT of the left hip revealed acute mildly displaced fractures of the left superior and inferior pubic rami with associated extraperitoneal left pelvic hematoma; this hematoma exerts mass effect on the bladder, without definite evidence of bladder injury. Trauma asked to see.  Nonsmoker Denies alcohol or illicit drug use Lives at home alone but does have several friends close that may be able to help after discharge Ambulates with cane  Family History  Problem Relation Age of Onset   Hypertension Mother    Heart disease Mother    Hypertension Father    Heart disease Father    Heart disease Brother     Past Medical History:  Diagnosis Date   Atrial fibrillation (HCC)    Dyslipidemia    Hypertension    Hypothyroid    Thyroid disease     Past Surgical History:  Procedure Laterality Date   ABDOMINAL HYSTERECTOMY      Social History:  reports that she quit smoking about 31 years ago. Her smoking use included cigarettes. She has a 3.00 pack-year smoking history. She has never been exposed to tobacco smoke. She does not have any smokeless tobacco history on file. She reports that she does not drink alcohol and does not use drugs.  Allergies:  Allergies  Allergen Reactions   Clindamycin Shortness Of Breath, Rash and  Other (See Comments)    Possible shortness of breath- definitely caused a congested nose   Diclofenac Sodium Rash and Other (See Comments)    Nasal congestion, also   Nsaids Rash and Other (See Comments)    Nasal congestion, also    (Not in a hospital admission)   Prior to Admission medications   Medication Sig Start Date End Date Taking? Authorizing Provider  acetaminophen (TYLENOL) 325 MG tablet Take 650 mg by mouth in the morning and at bedtime.    [provider]  alendronate (FOSAMAX) 70 MG tablet Take 70 mg by mouth every Monday. Take with a full glass of water on an empty stomach.    [provider]  Cholecalciferol (VITAMIN D3) 50 MCG (2000 UT) TABS Take 1 tablet by mouth daily.    [provider]  diltiazem (CARDIZEM CD) 300 MG 24 hr capsule TAKE ONE CAPSULE BY MOUTH EVERY EVENING 05/08/22   Tolia, Sunit, DO  ELIQUIS 5 MG TABS tablet Take 5 mg by mouth 2 (two) times daily. 12/06/21   [provider]  hydrochlorothiazide (HYDRODIURIL) 25 MG tablet Take 25 mg by mouth in the morning.    [provider]  levothyroxine (SYNTHROID) 88 MCG tablet Take 88 mcg by mouth daily before breakfast.    [provider]  Oyster Shell 500 MG TABS Take 500 mg by mouth daily.    [provider]  potassium  chloride SA (KLOR-CON M) 20 MEQ tablet Take 20 mEq by mouth daily with breakfast.    [provider]  pyridoxine (B-6) 100 MG tablet Take 100 mg by mouth daily.    [provider]  sertraline (ZOLOFT) 50 MG tablet Take 50 mg by mouth daily.    [provider]  vitamin C (ASCORBIC ACID) 500 MG tablet Take 500 mg by mouth daily.    [provider]    Blood pressure 115/67, pulse (!) 122, temperature 97.6 F (36.4 C), temperature source Oral, resp. rate 17, SpO2 97 %. Physical Exam: General: pleasant, elderly WD/WN female who is laying in bed in NAD HEENT: head is normocephalic, atraumatic.  Sclera are  noninjected.  Pupils equal and round and reactive to light.  Ears and nose without any masses or lesions.  Mouth is pink and moist. Dentition fair. No neck pain with active ROM Heart: irregular rhythm, rate 90s-120s. Palpable radial and pedal pulses bilaterally  Lungs: CTAB, no wheezes, rhonchi, or rales noted.  Respiratory effort nonlabored on room air Abd: soft, NT/ND, +BS, no masses, hernias, or organomegaly MS: trace BLE edema, calves soft and nontender. Large hematoma noted to left hip, remained of thigh is soft and compartments compressible Skin: warm and dry with no masses, lesions, or rashes. Bruising of different stages noted to right forearm and left lower leg. Small superficial skin tear noted to left elbow Psych: A&Ox4 with an appropriate affect Neuro: MAEs, no gross motor or sensory deficits BUE/BLE  Results for orders placed or performed during the hospital encounter of 05/08/22 (from the past 48 hour(s))  Basic metabolic panel     Status: Abnormal   Collection Time: 05/08/22  1:24 PM  Result Value Ref Range   Sodium 139 135 - 145 mmol/L   Potassium 3.5 3.5 - 5.1 mmol/L   Chloride 99 98 - 111 mmol/L   CO2 29 22 - 32 mmol/L   Glucose, Bld 170 (H) 70 - 99 mg/dL    Comment: Glucose reference range applies only to samples taken after fasting for at least 8 hours.   BUN 15 8 - 23 mg/dL   Creatinine, Ser 8.46 0.44 - 1.00 mg/dL   Calcium 9.6 8.9 - 96.2 mg/dL   GFR, Estimated >95 >28 mL/min    Comment: (NOTE) Calculated using the CKD-EPI Creatinine Equation (2021)    Anion gap 11 5 - 15    Comment: Performed at [facility], [address], [place], [place] [address]  CBC with Differential     Status: Abnormal   Collection Time: 05/08/22  1:24 PM  Result Value Ref Range   WBC 17.3 (H) 4.0 - 10.5 K/uL   RBC 3.93 3.87 - 5.11 MIL/uL   Hemoglobin 11.0 (L) 12.0 - 15.0 g/dL   HCT 40.1 (L) 02.7 - 25.3 %   MCV 88.0 80.0 - 100.0 fL   MCH 28.0 26.0 - 34.0 pg   MCHC  31.8 30.0 - 36.0 g/dL   RDW 66.4 40.3 - 47.4 %   Platelets 193 150 - 400 K/uL   nRBC 0.0 0.0 - 0.2 %   Neutrophils Relative % 87 %   Neutro Abs 15.2 (H) 1.7 - 7.7 K/uL   Lymphocytes Relative 4 %   Lymphs Abs 0.6 (L) 0.7 - 4.0 K/uL   Monocytes Relative 8 %   Monocytes Absolute 1.3 (H) 0.1 - 1.0 K/uL   Eosinophils Relative 0 %   Eosinophils Absolute 0.0 0.0 -  0.5 K/uL   Basophils Relative 0 %   Basophils Absolute 0.0 0.0 - 0.1 K/uL   Immature Granulocytes 1 %   Abs Immature Granulocytes 0.15 (H) 0.00 - 0.07 K/uL    Comment: Performed at [facility] [address] [place], [place] [address]   DG Chest Port 1 View  Result Date: 05/08/2022 CLINICAL DATA:  Fall.  On blood thinners. EXAM: PORTABLE CHEST 1 VIEW COMPARISON:  10/18/2021 FINDINGS: Left humeral shaft nonacute fracture with possible underlying osseous lesion and prominent callus deposition. New since 10/18/2021. Osteopenia. Midline trachea. Normal heart size for level of inspiration. Atherosclerosis in the transverse aorta. No pleural effusion or pneumothorax. Left upper lobe calcified granuloma of 1.0 cm. Clear right lung. Left clavicular foreshortening is similar and possibly postoperative. Osteopenia. Possible lower left anterior rib fractures. IMPRESSION: Osteopenia with possible lower left rib fractures. No pleural fluid or pneumothorax. CT is pending. Interval left humeral shaft nonacute fracture with suggestion of underlying osseous lesion. Recommend dedicated humerus radiographs. Aortic Atherosclerosis (ICD10-I70.0). Electronically Signed   By: Jeronimo Greaves M.D.   On: 05/08/2022 15:23   CT Hip Left Wo Contrast  Result Date: 05/08/2022 CLINICAL DATA:  Hip pain status post fall. EXAM: CT OF THE LEFT HIP WITHOUT CONTRAST TECHNIQUE: Multidetector CT imaging of the left hip was performed according to the standard protocol. Multiplanar CT image reconstructions were also generated. RADIATION DOSE REDUCTION: This exam  was performed according to the departmental dose-optimization program which includes automated exposure control, adjustment of the mA and/or kV according to patient size and/or use of iterative reconstruction technique. COMPARISON:  Radiographs same date. No older relevant comparison studies. FINDINGS: Bones/Joint/Cartilage Asymmetric moderate left hip osteoarthritis with ring osteophytes likely accounting for the sclerosis on earlier radiographs. There is no evidence of acute left femoral neck fracture or dislocation. However, there are comminuted and mildly displaced fractures of the left superior and inferior pubic rami. In addition, there is evidence of old posttraumatic deformities of both parasymphyseal regions. No diastasis of the sacroiliac joints or symphysis pubis identified. Ligaments Suboptimally assessed by CT. Muscles and Tendons Moderate size left pelvic sidewall hematoma with extraperitoneal left perivesical extension measuring up to 5.8 x 5.6 cm on image 26/3. Resulting mass effect on the bladder. No significant hematoma within the visualized proximal left thigh. Soft tissues As above, extraperitoneal hematoma on the left related to the pubic rami fractures. There is mass effect on the bladder without definite evidence of bladder injury. No significant pelvic ascites. Subcutaneous edema is present laterally within the left proximal thigh. Iliofemoral atherosclerosis noted. IMPRESSION: 1. Acute mildly displaced fractures of the left superior and inferior pubic rami with associated extraperitoneal left pelvic hematoma. This hematoma exerts mass effect on the bladder, without definite evidence of bladder injury. The bladder is incompletely visualized by this examination. Correlate clinically for presence of hematuria which may prompt need for further imaging. 2. No evidence of proximal femur fracture or dislocation. 3. Moderate asymmetric left hip osteoarthritic changes. Electronically Signed   By:  Carey Bullocks M.D.   On: 05/08/2022 14:12   DG Hip Unilat With Pelvis 2-3 Views Left  Result Date: 05/08/2022 CLINICAL DATA:  Trauma, fall EXAM: DG HIP (WITH OR WITHOUT PELVIS) 2-3V LEFT COMPARISON:  None Available. FINDINGS: No displaced fracture or dislocation is seen. There is faint transverse sclerosis in subcapital portion of neck of left femur. There is questionable minimal offset in alignment of cortical margin along the medial aspect of subcapital portion of  neck. Degenerative changes are noted in visualized lower lumbar spine and SI joints. There is soft tissue swelling lateral to the left hip. IMPRESSION: No displaced fracture or dislocation is seen. There is faint transverse sclerosis in the subcapital portion of neck of left femur which may be an artifact due to confluence of posterior margin of the acetabulum or suggest impacted fracture. If clinically warranted, follow-up CT may be considered. Lumbar spondylosis. Electronically Signed   By: Ernie Avena M.D.   On: 05/08/2022 13:01      Assessment/Plan Fall Left superior/inferior pubic rami fxs - ortho to see. NWB BLE until they make recommendations Extraperitoneal left pelvic hematoma causing mass effect on bladder - if unable to void will need to I&O for urinalysis to look for RBCs and possible bladder injury ABL anemia - Hgb 11 (it was 14.4, 6 months ago). Reversing and holding eliquis, repeat CBC in AM Left elbow skin tear - local wound care A fib on eliquis - will reverse eliquis and hold on admission HTN - home meds Hypothyroidism - home synthroid Depression - home zoloft  ID - none indicated VTE - SCDs only for now, hold eliquis FEN - IVF, NPO until seen by ortho Foley - none  Dispo - Final CT scan reports pending. Ortho consult pending. Transfer to [facility] for admission to trauma service. Will need PT/OT.  I reviewed ED provider notes, last 24 h vitals and pain scores, last 48 h intake and output, last 24 h labs  and trends, and last 24 h imaging results.   Franne Forts, PA-C [facility] 05/08/2022, 3:26 PM Please see Amion for pager number during day hours 7:00am-4:30pm

## 2022-05-08 NOTE — TOC CAGE-AID Note (Signed)
Transition of Care [facility]) - CAGE-AID Screening   Patient Details  Name: Maria [name] MRN: [id number] Date of Birth: 09-11-1933  [facility] CM/SW Contact:    Katha Hamming, RN Phone [phone] 05/08/2022, 7:35 PM   Clinical Narrative: Presents via [facility] after a fall down 2-3 steps, resulting in pelvic fxs with pelvic hematoma. No hx drug/alcohol use, no resources indicated.   CAGE-AID Screening:    Have You Ever Felt You Ought to Cut Down on Your Drinking or Drug Use?: No Have People Annoyed You By Critizing Your Drinking Or Drug Use?: No Have You Felt Bad Or Guilty About Your Drinking Or Drug Use?: No Have You Ever Had a Drink or Used Drugs First Thing In The Morning to Steady Your Nerves or to Get Rid of a Hangover?: No CAGE-AID Score: 0  Substance Abuse Education Offered: No

## 2022-05-09 LAB — CBC
HCT: 24.5 % — ABNORMAL LOW (ref 36.0–46.0)
HCT: 25.1 % — ABNORMAL LOW (ref 36.0–46.0)
HCT: 23.5 % — ABNORMAL LOW (ref 36.0–46.0)
Hemoglobin: 7.9 g/dL — ABNORMAL LOW (ref 12.0–15.0)
Hemoglobin: 8.3 g/dL — ABNORMAL LOW (ref 12.0–15.0)
Hemoglobin: 7.8 g/dL — ABNORMAL LOW (ref 12.0–15.0)
MCH: 28.2 pg (ref 26.0–34.0)
MCH: 29.5 pg (ref 26.0–34.0)
MCH: 29.4 pg (ref 26.0–34.0)
MCHC: 32.2 g/dL (ref 30.0–36.0)
MCHC: 33.1 g/dL (ref 30.0–36.0)
MCHC: 33.2 g/dL (ref 30.0–36.0)
MCV: 87.5 fL (ref 80.0–100.0)
MCV: 89.3 fL (ref 80.0–100.0)
MCV: 88.7 fL (ref 80.0–100.0)
Platelets: 164 10*3/uL (ref 150–400)
Platelets: 131 10*3/uL — ABNORMAL LOW (ref 150–400)
Platelets: 129 10*3/uL — ABNORMAL LOW (ref 150–400)
RBC: 2.8 MIL/uL — ABNORMAL LOW (ref 3.87–5.11)
RBC: 2.81 MIL/uL — ABNORMAL LOW (ref 3.87–5.11)
RBC: 2.65 MIL/uL — ABNORMAL LOW (ref 3.87–5.11)
RDW: 15.2 % (ref 11.5–15.5)
RDW: 14.9 % (ref 11.5–15.5)
RDW: 14.9 % (ref 11.5–15.5)
WBC: 12.9 10*3/uL — ABNORMAL HIGH (ref 4.0–10.5)
WBC: 17.5 10*3/uL — ABNORMAL HIGH (ref 4.0–10.5)
WBC: 15.7 10*3/uL — ABNORMAL HIGH (ref 4.0–10.5)
nRBC: 0 % (ref 0.0–0.2)
nRBC: 0 % (ref 0.0–0.2)
nRBC: 0 % (ref 0.0–0.2)

## 2022-05-09 LAB — SURGICAL PCR SCREEN
MRSA, PCR: NEGATIVE
Staphylococcus aureus: NEGATIVE

## 2022-05-09 LAB — BASIC METABOLIC PANEL
Anion gap: 8 (ref 5–15)
BUN: 14 mg/dL (ref 8–23)
CO2: 28 mmol/L (ref 22–32)
Calcium: 8.5 mg/dL — ABNORMAL LOW (ref 8.9–10.3)
Chloride: 99 mmol/L (ref 98–111)
Creatinine, Ser: 0.88 mg/dL (ref 0.44–1.00)
GFR, Estimated: >60 mL/min (ref >60)
Glucose, Bld: 171 mg/dL — ABNORMAL HIGH (ref 70–99)
Potassium: 3.4 mmol/L — ABNORMAL LOW (ref 3.5–5.1)
Sodium: 135 mmol/L (ref 135–145)

## 2022-05-09 LAB — ABO/RH: ABO/RH(D): O POS

## 2022-05-09 LAB — PREPARE RBC (CROSSMATCH)

## 2022-05-09 MED ORDER — ORAL CARE MOUTH RINSE: 15.0000 mL | OROMUCOSAL | Status: DC | PRN

## 2022-05-09 MED ORDER — SODIUM CHLORIDE 0.9% IV SOLUTION: Freq: Once | INTRAVENOUS | Status: AC

## 2022-05-09 MED ORDER — CHLORHEXIDINE GLUCONATE 0.12 % MT SOLN
15.0000 mL | Freq: Once | OROMUCOSAL | Status: AC
  Administered 2022-05-09: 15 mL via OROMUCOSAL

## 2022-05-09 MED ORDER — CHLORHEXIDINE GLUCONATE CLOTH 2 % EX PADS
6.0000 | MEDICATED_PAD | Freq: Every day | CUTANEOUS | Status: DC
  Administered 2022-05-09 – 2022-05-14 (×6): 6 via TOPICAL

## 2022-05-09 MED ORDER — POTASSIUM CHLORIDE CRYS ER 20 MEQ PO TBCR
40.0000 meq | EXTENDED_RELEASE_TABLET | Freq: Once | ORAL | Status: AC
  Administered 2022-05-09: 40 meq via ORAL
  Filled 2022-05-09: qty 2

## 2022-05-09 NOTE — Progress Notes (Signed)
Patient ID: Maria [name], female   DOB: June 09, 1934, 88 y.o.   MRN: [id number] Follow up - Trauma Critical Care   Patient Details:    Maria [name] is an 88 y.o. female.  Lines/tubes : Urethral Catheter Gordy Savers RN Latex 14 Fr. (Active)  Indication for Insertion or Continuance of Catheter Bladder outlet obstruction / other urologic reason 05/08/22 2017  Site Assessment Clean, Dry, Intact 05/08/22 2017  Catheter Maintenance Bag below level of bladder;Drainage bag/tubing not touching floor;Catheter secured;Insertion date on drainage bag;No dependent loops;Seal intact 05/08/22 2017  Collection Container Standard drainage bag 05/08/22 2017  Securement Method Securing device (Describe) 05/08/22 2017  Urinary Catheter Interventions (if applicable) Unclamped 05/08/22 2017  Output (mL) 60 mL 05/09/22 0500    Microbiology/Sepsis markers: Results for orders placed or performed during the hospital encounter of 05/08/22  Resp Panel by RT-PCR (Flu A&B, Covid) Anterior Nasal Swab     Status: None   Collection Time: 05/08/22  4:20 PM   Specimen: Anterior Nasal Swab  Result Value Ref Range Status   SARS Coronavirus 2 by RT PCR NEGATIVE NEGATIVE Final    Comment: (NOTE) SARS-CoV-2 target nucleic acids are NOT DETECTED.  The SARS-CoV-2 RNA is generally detectable in upper respiratory specimens during the acute phase of infection. The lowest concentration of SARS-CoV-2 viral copies this assay can detect is 138 copies/mL. A negative result does not preclude SARS-Cov-2 infection and should not be used as the sole basis for treatment or other patient management decisions. A negative result may occur with  improper specimen collection/handling, submission of specimen other than nasopharyngeal swab, presence of viral mutation(s) within the areas targeted by this assay, and inadequate number of viral copies(<138 copies/mL). A negative result must be combined with clinical observations, patient  history, and epidemiological information. The expected result is Negative.  Fact Sheet for Patients:  BloggerCourse.com  Fact Sheet for Healthcare Providers:  SeriousBroker.it  This test is no t yet approved or cleared by the [place] FDA and  has been authorized for detection and/or diagnosis of SARS-CoV-2 by FDA under an Emergency Use Authorization (EUA). This EUA will remain  in effect (meaning this test can be used) for the duration of the COVID-19 declaration under Section 564(b)(1) of the Act, 21 U.S.C.section 360bbb-3(b)(1), unless the authorization is terminated  or revoked sooner.       Influenza A by PCR NEGATIVE NEGATIVE Final   Influenza B by PCR NEGATIVE NEGATIVE Final    Comment: (NOTE) The Xpert Xpress SARS-CoV-2/FLU/RSV plus assay is intended as an aid in the diagnosis of influenza from Nasopharyngeal swab specimens and should not be used as a sole basis for treatment. Nasal washings and aspirates are unacceptable for Xpert Xpress SARS-CoV-2/FLU/RSV testing.  Fact Sheet for Patients: BloggerCourse.com  Fact Sheet for Healthcare Providers: SeriousBroker.it  This test is not yet approved or cleared by the [place] FDA and has been authorized for detection and/or diagnosis of SARS-CoV-2 by FDA under an Emergency Use Authorization (EUA). This EUA will remain in effect (meaning this test can be used) for the duration of the COVID-19 declaration under Section 564(b)(1) of the Act, 21 U.S.C. section 360bbb-3(b)(1), unless the authorization is terminated or revoked.  Performed at [facility], [address] [place], [place] [address]   Surgical PCR screen     Status: None   Collection Time: 05/09/22  1:39 AM   Specimen: Nasal Mucosa; Nasal Swab  Result Value Ref Range Status  MRSA, PCR NEGATIVE NEGATIVE Final   Staphylococcus  aureus NEGATIVE NEGATIVE Final    Comment: (NOTE) The Xpert SA Assay (FDA approved for NASAL specimens in patients 33 years of age and older), is one component of a comprehensive surveillance program. It is not intended to diagnose infection nor to guide or monitor treatment. Performed at [facility], [address] [place], [place] [address]     Anti-infectives:  Anti-infectives (From admission, onward)    None       Best Practice/Protocols:  VTE Prophylaxis: Mechanical /  Consults: Treatment Team:  Ccs, Md, MD Sheral Apley, MD    Studies:    Events:  Subjective:    Overnight Issues:   Objective:  Vital signs for last 24 hours: Temp:  [97.5 F (36.4 C)-98.3 F (36.8 C)] 97.6 F (36.4 C) (09/07 0800) Pulse Rate:  [77-123] 82 (09/07 0800) Resp:  [12-41] 15 (09/07 0800) BP: (90-147)/(54-100) 96/54 (09/07 0800) SpO2:  [90 %-97 %] 93 % (09/07 0800) Weight:  [64 kg-67 kg] 67 kg (09/07 0600)  Hemodynamic parameters for last 24 hours:    Intake/Output from previous day: 09/06 0701 - 09/07 0700 In: 963.3 [P.O.:480; I.V.:483.3] Out: 320 [Urine:320]  Intake/Output this shift: No intake/output data recorded.  Vent settings for last 24 hours:    Physical Exam:  General: alert and no respiratory distress Neuro: A&O HEENT/Neck: NT Resp: clear to auscultation bilaterally CVS: irr irr GI: soft, NT, NT Extremities: tender L hip/contusion  Results for orders placed or performed during the hospital encounter of 05/08/22 (from the past 24 hour(s))  Basic metabolic panel     Status: Abnormal   Collection Time: 05/08/22  1:24 PM  Result Value Ref Range   Sodium 139 135 - 145 mmol/L   Potassium 3.5 3.5 - 5.1 mmol/L   Chloride 99 98 - 111 mmol/L   CO2 29 22 - 32 mmol/L   Glucose, Bld 170 (H) 70 - 99 mg/dL   BUN 15 8 - 23 mg/dL   Creatinine, Ser 4.03 0.44 - 1.00 mg/dL   Calcium 9.6 8.9 - 47.4 mg/dL   GFR, Estimated >25 >95 mL/min   Anion gap  11 5 - 15  CBC with Differential     Status: Abnormal   Collection Time: 05/08/22  1:24 PM  Result Value Ref Range   WBC 17.3 (H) 4.0 - 10.5 K/uL   RBC 3.93 3.87 - 5.11 MIL/uL   Hemoglobin 11.0 (L) 12.0 - 15.0 g/dL   HCT 63.8 (L) 75.6 - 43.3 %   MCV 88.0 80.0 - 100.0 fL   MCH 28.0 26.0 - 34.0 pg   MCHC 31.8 30.0 - 36.0 g/dL   RDW 29.5 18.8 - 41.6 %   Platelets 193 150 - 400 K/uL   nRBC 0.0 0.0 - 0.2 %   Neutrophils Relative % 87 %   Neutro Abs 15.2 (H) 1.7 - 7.7 K/uL   Lymphocytes Relative 4 %   Lymphs Abs 0.6 (L) 0.7 - 4.0 K/uL   Monocytes Relative 8 %   Monocytes Absolute 1.3 (H) 0.1 - 1.0 K/uL   Eosinophils Relative 0 %   Eosinophils Absolute 0.0 0.0 - 0.5 K/uL   Basophils Relative 0 %   Basophils Absolute 0.0 0.0 - 0.1 K/uL   Immature Granulocytes 1 %   Abs Immature Granulocytes 0.15 (H) 0.00 - 0.07 K/uL  Hepatic function panel     Status: Abnormal   Collection Time: 05/08/22  1:25  PM  Result Value Ref Range   Total Protein 7.6 6.5 - 8.1 g/dL   Albumin 4.1 3.5 - 5.0 g/dL   AST 52 (H) 15 - 41 U/L   ALT 25 0 - 44 U/L   Alkaline Phosphatase 60 38 - 126 U/L   Total Bilirubin 1.1 0.3 - 1.2 mg/dL   Bilirubin, Direct 0.2 0.0 - 0.2 mg/dL   Indirect Bilirubin 0.9 0.3 - 0.9 mg/dL  CK     Status: Abnormal   Collection Time: 05/08/22  1:25 PM  Result Value Ref Range   Total CK 323 (H) 38 - 234 U/L  Resp Panel by RT-PCR (Flu A&B, Covid) Anterior Nasal Swab     Status: None   Collection Time: 05/08/22  4:20 PM   Specimen: Anterior Nasal Swab  Result Value Ref Range   SARS Coronavirus 2 by RT PCR NEGATIVE NEGATIVE   Influenza A by PCR NEGATIVE NEGATIVE   Influenza B by PCR NEGATIVE NEGATIVE  Ethanol     Status: None   Collection Time: 05/08/22  4:20 PM  Result Value Ref Range   Alcohol, Ethyl (B) <10 <10 mg/dL  Lactic acid, plasma     Status: Abnormal   Collection Time: 05/08/22  4:20 PM  Result Value Ref Range   Lactic Acid, Venous 3.2 (HH) 0.5 - 1.9 mmol/L  Protime-INR      Status: Abnormal   Collection Time: 05/08/22  4:20 PM  Result Value Ref Range   Prothrombin Time 16.0 (H) 11.4 - 15.2 seconds   INR 1.3 (H) 0.8 - 1.2  Sample to Blood Bank     Status: None   Collection Time: 05/08/22  4:21 PM  Result Value Ref Range   Blood Bank Specimen SAMPLE AVAILABLE FOR TESTING    Sample Expiration      05/11/2022,2359 Performed at [facility], [address], [place], [place] [address]   I-Stat Chem 8, ED     Status: Abnormal   Collection Time: 05/08/22  5:11 PM  Result Value Ref Range   Sodium 136 135 - 145 mmol/L   Potassium 2.9 (L) 3.5 - 5.1 mmol/L   Chloride 94 (L) 98 - 111 mmol/L   BUN 12 8 - 23 mg/dL   Creatinine, Ser 6.33 0.44 - 1.00 mg/dL   Glucose, Bld 354 (H) 70 - 99 mg/dL   Calcium, Ion 5.62 (L) 1.15 - 1.40 mmol/L   TCO2 29 22 - 32 mmol/L   Hemoglobin 9.5 (L) 12.0 - 15.0 g/dL   HCT 56.3 (L) 89.3 - 73.4 %  Urinalysis, Routine w reflex microscopic     Status: None   Collection Time: 05/08/22  7:03 PM  Result Value Ref Range   Color, Urine YELLOW YELLOW   APPearance CLEAR CLEAR   Specific Gravity, Urine <=1.005 1.005 - 1.030   pH 6.0 5.0 - 8.0   Glucose, UA NEGATIVE NEGATIVE mg/dL   Hgb urine dipstick NEGATIVE NEGATIVE   Bilirubin Urine NEGATIVE NEGATIVE   Ketones, ur NEGATIVE NEGATIVE mg/dL   Protein, ur NEGATIVE NEGATIVE mg/dL   Nitrite NEGATIVE NEGATIVE   Leukocytes,Ua NEGATIVE NEGATIVE  CBC     Status: Abnormal   Collection Time: 05/08/22  8:53 PM  Result Value Ref Range   WBC 12.7 (H) 4.0 - 10.5 K/uL   RBC 3.11 (L) 3.87 - 5.11 MIL/uL   Hemoglobin 8.8 (L) 12.0 - 15.0 g/dL   HCT 28.7 (L) 68.1 - 15.7 %   MCV 86.2  80.0 - 100.0 fL   MCH 28.3 26.0 - 34.0 pg   MCHC 32.8 30.0 - 36.0 g/dL   RDW 70.0 17.4 - 94.4 %   Platelets 179 150 - 400 K/uL   nRBC 0.0 0.0 - 0.2 %  Surgical PCR screen     Status: None   Collection Time: 05/09/22  1:39 AM   Specimen: Nasal Mucosa; Nasal Swab  Result Value Ref Range   MRSA, PCR  NEGATIVE NEGATIVE   Staphylococcus aureus NEGATIVE NEGATIVE  CBC     Status: Abnormal   Collection Time: 05/09/22  1:59 AM  Result Value Ref Range   WBC 12.9 (H) 4.0 - 10.5 K/uL   RBC 2.80 (L) 3.87 - 5.11 MIL/uL   Hemoglobin 7.9 (L) 12.0 - 15.0 g/dL   HCT 96.7 (L) 59.1 - 63.8 %   MCV 87.5 80.0 - 100.0 fL   MCH 28.2 26.0 - 34.0 pg   MCHC 32.2 30.0 - 36.0 g/dL   RDW 46.6 59.9 - 35.7 %   Platelets 164 150 - 400 K/uL   nRBC 0.0 0.0 - 0.2 %  Basic metabolic panel     Status: Abnormal   Collection Time: 05/09/22  1:59 AM  Result Value Ref Range   Sodium 135 135 - 145 mmol/L   Potassium 3.4 (L) 3.5 - 5.1 mmol/L   Chloride 99 98 - 111 mmol/L   CO2 28 22 - 32 mmol/L   Glucose, Bld 171 (H) 70 - 99 mg/dL   BUN 14 8 - 23 mg/dL   Creatinine, Ser 0.17 0.44 - 1.00 mg/dL   Calcium 8.5 (L) 8.9 - 10.3 mg/dL   GFR, Estimated >79 >39 mL/min   Anion gap 8 5 - 15    Assessment & Plan: Present on Admission:  Multiple pelvic fractures (HCC)  Fall    LOS: 1 day   Additional comments:I reviewed the patient's new clinical lab test results. And CTs Fall Left superior/inferior pubic rami fxs - Ortho [place] see. NWB BLE until they make recommendations Extraperitoneal left pelvic hematoma causing mass effect on bladder - foley in place with pelvic FX, urine clear. Ortho Trauma consult P ABL anemia - Hgb down to 7.9, BP soft, 1u PRBC noe, CBC q6 Left elbow skin tear - local wound care A fib on eliquis - eliquis reversed and hold on admission HTN - home meds Hypothyroidism - home synthroid Depression - home zoloft   ID - none indicated VTE - SCDs only for now, hold eliquis FEN - IVF, NPO until seen by ortho Foley - none   Dispo - ICU Lives alone. Retired Charity fundraiser. Critical Care Total Time*: 39 Minutes  Violeta Gelinas, MD, MPH, FACS Trauma & General Surgery Use [email or web address] to contact on call provider  05/09/2022  *Care during the described time interval was provided by me. I have reviewed this  patient's available data, including medical history, events of note, physical examination and test results as part of my evaluation.

## 2022-05-09 NOTE — Evaluation (Signed)
Physical Therapy Evaluation Patient Details Name: Maria [name] MRN: [id number] DOB: Oct 14, 1933 Today's Date: 05/09/2022  History of Present Illness  88 yo female presenting to ED on 9/6 after fall at home. Imaging showing Acute mildly displaced fractures of the left superior and inferior pubic rami with associated extraperitoneal left pelvic hematoma. Per ortho consult, non-operative treatment and WBAT BLEs. PMH including a-fib, HTN, hypothyroid, and abdominal hysterectomy.   Clinical Impression  Pt in bed upon arrival of PT, agreeable to evaluation at this time. Prior to admission the pt was independent with all mobility without use of DME, driving and living alone. The pt now presents with limitations in functional mobility, strength, power, dynamic stability, and activity tolerance due to above dx and resulting pain, and will continue to benefit from skilled PT to address these deficits. The pt required up to [facility] of 2 for initial stand and presents with strong posterior lean and needed max cues for hand positioning and use of RW. The pt was then able to manage small lateral steps to recliner but needed up to maxA due to poor functional stability, fatigue, and poor dynamic stability. Given prior level of independence, pt will benefit from short stint acute inpatient rehab after d/c to maximize functional recovery and return to prior level of independence.         Recommendations for follow up therapy are one component of a multi-disciplinary discharge planning process, led by the attending physician.  Recommendations may be updated based on patient status, additional functional criteria and insurance authorization.  Follow Up Recommendations Acute inpatient rehab (3hours/day)      Assistance Recommended at Discharge Frequent or constant Supervision/Assistance  Patient can return home with the following  A lot of help with walking and/or transfers;A lot of help with  bathing/dressing/bathroom;Assistance with feeding;Assistance with cooking/housework;Direct supervision/assist for medications management;Direct supervision/assist for financial management;Help with stairs or ramp for entrance    Equipment Recommendations None recommended by PT  Recommendations for Other Services  Rehab consult    Functional Status Assessment Patient has had a recent decline in their functional status and demonstrates the ability to make significant improvements in function in a reasonable and predictable amount of time.     Precautions / Restrictions Precautions Precautions: Fall Precaution Comments: watch BP Restrictions Weight Bearing Restrictions: Yes RLE Weight Bearing: Weight bearing as tolerated LLE Weight Bearing: Weight bearing as tolerated      Mobility  Bed Mobility Overal bed mobility: Needs Assistance Bed Mobility: Supine to Sit     Supine to sit: Min assist, HOB elevated     General bed mobility comments: Min A to elevate trunk    Transfers Overall transfer level: Needs assistance Equipment used: Rolling walker (2 wheels) Transfers: Sit to/from Stand, Bed to chair/wheelchair/BSC Sit to Stand: From elevated surface, +2 safety/equipment, Mod assist   Step pivot transfers: Max assist, +2 physical assistance, +2 safety/equipment       General transfer comment: modA to power up to standing with sustainted assist neededto manage posterior lean. max cues for hand positioning and to push down through RW rather than pulling up. maxA to take pivotal steps to recliner with pt progressively needing more assist to maintain upright. potentialyl limited by low BP at this time. future sit-stand reps needing minA only with less posterior lean    Ambulation/Gait Ambulation/Gait assistance: Max assist, +2 safety/equipment Gait Distance (Feet): 3 Feet Assistive device: Rolling walker (2 wheels) Gait Pattern/deviations: Step-to pattern Gait velocity:  decreased Gait velocity  interpretation: <1.31 ft/sec, indicative of household ambulator   General Gait Details: small lateral steps with trunk flexed, cues for each step, and posterior lean     Balance Overall balance assessment: Needs assistance Sitting-balance support: No upper extremity supported, Feet supported Sitting balance-Leahy Scale: Fair     Standing balance support: Bilateral upper extremity supported, During functional activity Standing balance-Leahy Scale: Poor                               Pertinent Vitals/Pain Pain Assessment Pain Assessment: Faces Faces Pain Scale: Hurts little more Pain Location: LLE Pain Descriptors / Indicators: Discomfort, Grimacing Pain Intervention(s): Monitored during session, Repositioned    Home Living Family/patient expects to be discharged to:: Private residence Living Arrangements: Alone Available Help at Discharge: Friend(s);Available 24 hours/day Type of Home: House Home Access: Stairs to enter   [employer] of Steps: 3 wide steps   Home Layout: One level Home Equipment: Conservation officer, nature (2 wheels);Cane - single point;Wheelchair - manual;Shower seat;BSC/3in1      Prior Function Prior Level of Function : Independent/Modified Independent;Driving             Mobility Comments: independent, no use of DME, fall in feb       Hand Dominance   Dominant Hand: Right    Extremity/Trunk Assessment   Upper Extremity Assessment Upper Extremity Assessment: Defer to OT evaluation    Lower Extremity Assessment Lower Extremity Assessment: Generalized weakness (limited by pain with functional stance, grossly functional against gravity  with no changes in sensation)    Cervical / Trunk Assessment Cervical / Trunk Assessment: Kyphotic  Communication   Communication: No difficulties  Cognition Arousal/Alertness: Awake/alert Behavior During Therapy: WFL for tasks assessed/performed Overall Cognitive  Status: Within Functional Limits for tasks assessed                                 General Comments: pt isa retired Therapist, sports, good Advertising account planner Comments General comments (skin integrity, edema, etc.): Upon sitting in recliner, pt staitng "I feel hot". She then was pale, clammy, and less verbal. BP soft. Elevating LEs and pt more verbal and responding to questions. Performing second sit<>stand to take BP. Sitting 111/45, standing 88/57, and sitting 97/53     Assessment/Plan    PT Assessment Patient needs continued PT services  PT Problem List Decreased strength;Decreased range of motion;Decreased activity tolerance;Decreased balance;Decreased mobility       PT Treatment Interventions DME instruction;Gait training;Stair training;Functional mobility training;Therapeutic activities;Therapeutic exercise;Balance training;Patient/family education    PT Goals (Current goals can be found in the Care Plan section)  Acute Rehab PT Goals Patient Stated Goal: return home and to independence PT Goal Formulation: With patient Time For Goal Achievement: 05/23/22 Potential to Achieve Goals: Good    Frequency Min 4X/week     Co-evaluation PT/OT/SLP Co-Evaluation/Treatment: Yes Reason for Co-Treatment: Complexity of the patient's impairments (multi-system involvement);Necessary to address cognition/behavior during functional activity;For patient/therapist safety;To address functional/ADL transfers PT goals addressed during session: Mobility/safety with mobility;Balance;Strengthening/ROM;Proper use of DME OT goals addressed during session: ADL's and self-care       AM-PAC PT "6 Clicks" Mobility  Outcome Measure Help needed turning from your back to your side while in a flat bed without using bedrails?: A Little Help needed moving from lying on your back to sitting on  the side of a flat bed without using bedrails?: A Little Help needed moving to and from a bed to a chair  (including a wheelchair)?: A Lot Help needed standing up from a chair using your arms (e.g., wheelchair or bedside chair)?: A Lot Help needed to walk in hospital room?: Total Help needed climbing 3-5 steps with a railing? : Total 6 Click Score: 12    End of Session Equipment Utilized During Treatment: Gait belt Activity Tolerance: Treatment limited secondary to medical complications (Comment);Patient tolerated treatment well (transient episode of decreased responsiveness with soft BP, pt then recovered in chair but limited to standing at this time) Patient left: in chair;with call bell/phone within reach;with chair alarm set;with nursing/sitter in room Nurse Communication: Mobility status PT Visit Diagnosis: Unsteadiness on feet (R26.81);Other abnormalities of gait and mobility (R26.89);Muscle weakness (generalized) (M62.81);History of falling (Z91.81)    Time: 7616-0737 PT Time Calculation (min) (ACUTE ONLY): 24 min   Charges:   PT Evaluation $PT Eval Moderate Complexity: 1 Mod          Vickki Muff, PT, DPT   Acute Rehabilitation Department  Ronnie Derby 05/09/2022, 1:36 PM

## 2022-05-09 NOTE — Evaluation (Signed)
Occupational Therapy Evaluation Patient Details Name: Maria [name] MRN: [id number] DOB: 01-05-1934 Today's Date: 05/09/2022   History of Present Illness 88 yo female presenting to ED on 9/6 after fall at home. Imaging showing Acute mildly displaced fractures of the left superior and inferior pubic rami with associated extraperitoneal left pelvic hematoma. Per ortho consult, non-operative treatment and WBAT BLEs. PMH including a-fib, HTN, hypothyroid, and abdominal hysterectomy.   Clinical Impression   PTA, pt was living alone and was independent. Pt currently requiring Min A for UB ADLs, Max A for LB ADLs, and Mod-Max A +2 for functional transfers. Pt presenting with decreased balance and strength. Despite pain and fatigue, pt is very motivated. During pivot to recliner, pt with possible hypotensive episode; face pale, hot to touch, and decreased verbalization. BP soft. RN present. Elevating LEs and pt making eye contact and answering questions. Performing second sit<>stand from recliner to take orthostatic BP (see general comments). Pt would benefit from further acute OT to facilitate safe dc. Recommend dc to AIR for intensive OT to optimize safety, independence with ADLs, and return to PLOF.      Recommendations for follow up therapy are one component of a multi-disciplinary discharge planning process, led by the attending physician.  Recommendations may be updated based on patient status, additional functional criteria and insurance authorization.   Follow Up Recommendations  Acute inpatient rehab (3hours/day)    Assistance Recommended at Discharge Frequent or constant Supervision/Assistance  Patient can return home with the following A lot of help with walking and/or transfers;A lot of help with bathing/dressing/bathroom    Functional Status Assessment  Patient has had a recent decline in their functional status and demonstrates the ability to make significant improvements in function in a  reasonable and predictable amount of time.  Equipment Recommendations  None recommended by OT    Recommendations for Other Services       Precautions / Restrictions Precautions Precautions: Fall Restrictions Weight Bearing Restrictions: Yes LLE Weight Bearing: Weight bearing as tolerated      Mobility Bed Mobility Overal bed mobility: Needs Assistance Bed Mobility: Supine to Sit     Supine to sit: Min assist, HOB elevated     General bed mobility comments: Min A to elevate trunk    Transfers Overall transfer level: Needs assistance Equipment used: Rolling walker (2 wheels) Transfers: Sit to/from Stand, Bed to chair/wheelchair/BSC Sit to Stand: Max assist, From elevated surface, +2 safety/equipment Stand pivot transfers: Mod assist, +2 physical assistance         General transfer comment: Max A to power up and correct posterior lean. Mod A +2 for pivot to recliner. Hypotensive episode at reclienr - see general comments      Balance Overall balance assessment: Needs assistance Sitting-balance support: No upper extremity supported, Feet supported Sitting balance-Leahy Scale: Fair     Standing balance support: Bilateral upper extremity supported, During functional activity Standing balance-Leahy Scale: Poor                             ADL either performed or assessed with clinical judgement   ADL Overall ADL's : Needs assistance/impaired Eating/Feeding: Set up;Sitting   Grooming: Supervision/safety;Set up;Sitting   Upper Body Bathing: Minimal assistance;Sitting   Lower Body Bathing: Maximal assistance;Sit to/from stand   Upper Body Dressing : Minimal assistance;Sitting   Lower Body Dressing: Maximal assistance;Sit to/from stand   Toilet Transfer: Moderate assistance;+2 for physical assistance;+2 for safety/equipment;Stand-pivot;Rolling  walker (2 wheels) (simulated to recliner) Toilet Transfer Details (indicate cue type and reason): posterior  lean and poor weight shift         Functional mobility during ADLs: Moderate assistance;+2 for physical assistance;+2 for safety/equipment;Rolling walker (2 wheels) General ADL Comments: Decreased balance, strength, and acitivty toelrance. Very motivated     Financial controller      Pertinent Vitals/Pain Pain Assessment Pain Assessment: Faces Faces Pain Scale: Hurts little more Pain Location: LLE Pain Descriptors / Indicators: Discomfort, Grimacing Pain Intervention(s): Monitored during session, Repositioned     Hand Dominance Right   Extremity/Trunk Assessment Upper Extremity Assessment Upper Extremity Assessment: Generalized weakness   Lower Extremity Assessment Lower Extremity Assessment: Defer to PT evaluation   Cervical / Trunk Assessment Cervical / Trunk Assessment: Kyphotic   Communication Communication Communication: No difficulties   Cognition Arousal/Alertness: Awake/alert Behavior During Therapy: WFL for tasks assessed/performed Overall Cognitive Status: Within Functional Limits for tasks assessed                                       General Comments  Upon sitting in recliner, pt staitng "I feel hot". She then was pale, clammy, and less verbal. BP soft. Elevating LEs and pt more verbal and responding to questions. Performing second sit<>stand to take BP. Sitting 111/45, standing 88/57, and sitting 97/53    Exercises     Shoulder Instructions      Home Living Family/patient expects to be discharged to:: Private residence Living Arrangements: Alone Available Help at Discharge: Friend(s);Available 24 hours/day Type of Home: House Home Access: Stairs to enter [employer] of Steps: 3 wide steps   Home Layout: One level     Bathroom Shower/Tub: Producer, television/film/video: Handicapped height     Home Equipment: Agricultural consultant (2 wheels);Cane - single point;Wheelchair - manual;Shower  seat;BSC/3in1          Prior Functioning/Environment Prior Level of Function : Independent/Modified Independent;Driving                        OT Problem List: Decreased strength;Decreased range of motion;Decreased activity tolerance;Impaired balance (sitting and/or standing);Decreased knowledge of use of DME or AE;Decreased knowledge of precautions      OT Treatment/Interventions: Self-care/ADL training;Therapeutic exercise;Energy conservation;DME and/or AE instruction;Therapeutic activities;Patient/family education    OT Goals(Current goals can be found in the care plan section) Acute Rehab OT Goals Patient Stated Goal: Get stronger to return home OT Goal Formulation: With patient Time For Goal Achievement: 05/23/22 Potential to Achieve Goals: Good  OT Frequency: Min 2X/week    Co-evaluation PT/OT/SLP Co-Evaluation/Treatment: Yes Reason for Co-Treatment: For patient/therapist safety;To address functional/ADL transfers   OT goals addressed during session: ADL's and self-care      AM-PAC OT "6 Clicks" Daily Activity     Outcome Measure Help from another person eating meals?: A Little Help from another person taking care of personal grooming?: A Little Help from another person toileting, which includes using toliet, bedpan, or urinal?: A Lot Help from another person bathing (including washing, rinsing, drying)?: A Lot Help from another person to put on and taking off regular upper body clothing?: A Little Help from another person to put on and taking off regular lower body clothing?: A Lot 6 Click Score: 15   End  of Session Equipment Utilized During Treatment: Rolling walker (2 wheels) Nurse Communication: Mobility status  Activity Tolerance: Patient tolerated treatment well Patient left: in chair;with call bell/phone within reach;with chair alarm set;with nursing/sitter in room  OT Visit Diagnosis: Other abnormalities of gait and mobility (R26.89);Unsteadiness on  feet (R26.81);Muscle weakness (generalized) (M62.81)                Time: 7425-9563 OT Time Calculation (min): 28 min Charges:  OT General Charges $OT Visit: 1 Visit OT Evaluation $OT Eval Moderate Complexity: 1 Mod  Jacquan Savas MSOT, OTR/L Acute Rehab Office: [phone]  Theodoro Grist Wilberto Console 05/09/2022, 12:51 PM

## 2022-05-09 NOTE — Progress Notes (Signed)
Inpatient Rehab Admissions Coordinator:   Per therapy recommendations pt was screened for CIR candidacy by Estill Dooms, PT, DPT.  Unfortunately, insurance unlikely to approve case for CIR based on medical necessity.    Estill Dooms, PT, DPT Admissions Coordinator [phone] 05/09/22  3:25 PM

## 2022-05-09 NOTE — Progress Notes (Signed)
  Transition of Care [facility]) Screening Note   Patient Details  Name: JENEFER WOERNER Date of Birth: 1933/10/31   Transition of Care [facility]) CM/SW Contact:    Harriet Masson, RN Phone Number: 05/09/2022, 9:14 AM    Transition of Care Department [facility]) has reviewed patient and no TOC needs have been identified at this time. We will continue to monitor patient advancement through interdisciplinary progression rounds. If new patient transition needs arise, please place a TOC consult.

## 2022-05-09 NOTE — Consult Note (Signed)
Reason for Consult:Pelvic fxs Referring Physician: Darnell Level Time called: 0730 Time at bedside: 1026   Maria [name] is an 88 y.o. female.  HPI: Frankye went out to feed her cats early yesterday morning in the dark. She slipped going down the steps to her deck and fell. She was able to get up with assistance from EMS and walk back into her house. She sat in the recliner for a couple of hours and couldn't get up again so was brought to the ED at [facility]. Workup showed pelvic fxs. She was admitted to the trauma service and transferred to [facility] and orthopedic surgery was consulted. She lives at home alone and generally ambulates with a cane.  Past Medical History:  Diagnosis Date   Atrial fibrillation (HCC)    Dyslipidemia    Hypertension    Hypothyroid    Thyroid disease     Past Surgical History:  Procedure Laterality Date   ABDOMINAL HYSTERECTOMY      Family History  Problem Relation Age of Onset   Hypertension Mother    Heart disease Mother    Hypertension Father    Heart disease Father    Heart disease Brother     Social History:  reports that she quit smoking about 31 years ago. Her smoking use included cigarettes. She has a 3.00 pack-year smoking history. She has never been exposed to tobacco smoke. She does not have any smokeless tobacco history on file. She reports that she does not drink alcohol and does not use drugs.  Allergies:  Allergies  Allergen Reactions   Clindamycin Shortness Of Breath, Rash and Other (See Comments)    Possible shortness of breath- definitely caused a congested nose   Diclofenac Sodium Rash and Other (See Comments)    Nasal congestion, also   Nsaids Rash and Other (See Comments)    Nasal congestion, also    Medications: I have reviewed the patient's current medications.  Results for orders placed or performed during the hospital encounter of 05/08/22 (from the past 48 hour(s))  Basic metabolic panel     Status: Abnormal   Collection Time:  05/08/22  1:24 PM  Result Value Ref Range   Sodium 139 135 - 145 mmol/L   Potassium 3.5 3.5 - 5.1 mmol/L   Chloride 99 98 - 111 mmol/L   CO2 29 22 - 32 mmol/L   Glucose, Bld 170 (H) 70 - 99 mg/dL    Comment: Glucose reference range applies only to samples taken after fasting for at least 8 hours.   BUN 15 8 - 23 mg/dL   Creatinine, Ser 2.87 0.44 - 1.00 mg/dL   Calcium 9.6 8.9 - 86.7 mg/dL   GFR, Estimated >67 >20 mL/min    Comment: (NOTE) Calculated using the CKD-EPI Creatinine Equation (2021)    Anion gap 11 5 - 15    Comment: Performed at [facility], [address] [place], [place] [address]  CBC with Differential     Status: Abnormal   Collection Time: 05/08/22  1:24 PM  Result Value Ref Range   WBC 17.3 (H) 4.0 - 10.5 K/uL   RBC 3.93 3.87 - 5.11 MIL/uL   Hemoglobin 11.0 (L) 12.0 - 15.0 g/dL   HCT 62.8 (L) 36.6 - 29.4 %   MCV 88.0 80.0 - 100.0 fL   MCH 28.0 26.0 - 34.0 pg   MCHC 31.8 30.0 - 36.0 g/dL   RDW 76.5 46.5 - 03.5 %   Platelets  193 150 - 400 K/uL   nRBC 0.0 0.0 - 0.2 %   Neutrophils Relative % 87 %   Neutro Abs 15.2 (H) 1.7 - 7.7 K/uL   Lymphocytes Relative 4 %   Lymphs Abs 0.6 (L) 0.7 - 4.0 K/uL   Monocytes Relative 8 %   Monocytes Absolute 1.3 (H) 0.1 - 1.0 K/uL   Eosinophils Relative 0 %   Eosinophils Absolute 0.0 0.0 - 0.5 K/uL   Basophils Relative 0 %   Basophils Absolute 0.0 0.0 - 0.1 K/uL   Immature Granulocytes 1 %   Abs Immature Granulocytes 0.15 (H) 0.00 - 0.07 K/uL    Comment: Performed at [facility], [address], [place], [place] [address]  Hepatic function panel     Status: Abnormal   Collection Time: 05/08/22  1:25 PM  Result Value Ref Range   Total Protein 7.6 6.5 - 8.1 g/dL   Albumin 4.1 3.5 - 5.0 g/dL   AST 52 (H) 15 - 41 U/L   ALT 25 0 - 44 U/L   Alkaline Phosphatase 60 38 - 126 U/L   Total Bilirubin 1.1 0.3 - 1.2 mg/dL   Bilirubin, Direct 0.2 0.0 - 0.2 mg/dL   Indirect Bilirubin 0.9 0.3 -  0.9 mg/dL    Comment: Performed at [facility], [address], [place], [place] [address]  CK     Status: Abnormal   Collection Time: 05/08/22  1:25 PM  Result Value Ref Range   Total CK 323 (H) 38 - 234 U/L    Comment: Performed at [facility], [address], [place], [place] [address]  Resp Panel by RT-PCR (Flu A&B, Covid) Anterior Nasal Swab     Status: None   Collection Time: 05/08/22  4:20 PM   Specimen: Anterior Nasal Swab  Result Value Ref Range   SARS Coronavirus 2 by RT PCR NEGATIVE NEGATIVE    Comment: (NOTE) SARS-CoV-2 target nucleic acids are NOT DETECTED.  The SARS-CoV-2 RNA is generally detectable in upper respiratory specimens during the acute phase of infection. The lowest concentration of SARS-CoV-2 viral copies this assay can detect is 138 copies/mL. A negative result does not preclude SARS-Cov-2 infection and should not be used as the sole basis for treatment or other patient management decisions. A negative result may occur with  improper specimen collection/handling, submission of specimen other than nasopharyngeal swab, presence of viral mutation(s) within the areas targeted by this assay, and inadequate number of viral copies(<138 copies/mL). A negative result must be combined with clinical observations, patient history, and epidemiological information. The expected result is Negative.  Fact Sheet for Patients:  BloggerCourse.com  Fact Sheet for Healthcare Providers:  SeriousBroker.it  This test is no t yet approved or cleared by the [place] FDA and  has been authorized for detection and/or diagnosis of SARS-CoV-2 by FDA under an Emergency Use Authorization (EUA). This EUA will remain  in effect (meaning this test can be used) for the duration of the COVID-19 declaration under Section 564(b)(1) of the Act, 21 U.S.C.section 360bbb-3(b)(1), unless the  authorization is terminated  or revoked sooner.       Influenza A by PCR NEGATIVE NEGATIVE   Influenza B by PCR NEGATIVE NEGATIVE    Comment: (NOTE) The Xpert Xpress SARS-CoV-2/FLU/RSV plus assay is intended as an aid in the diagnosis of influenza from Nasopharyngeal swab specimens and should not be used as a sole basis for treatment. Nasal washings and aspirates  are unacceptable for Xpert Xpress SARS-CoV-2/FLU/RSV testing.  Fact Sheet for Patients: BloggerCourse.com  Fact Sheet for Healthcare Providers: SeriousBroker.it  This test is not yet approved or cleared by the [place] FDA and has been authorized for detection and/or diagnosis of SARS-CoV-2 by FDA under an Emergency Use Authorization (EUA). This EUA will remain in effect (meaning this test can be used) for the duration of the COVID-19 declaration under Section 564(b)(1) of the Act, 21 U.S.C. section 360bbb-3(b)(1), unless the authorization is terminated or revoked.  Performed at [facility], [address] [place], [place] [address]   Ethanol     Status: None   Collection Time: 05/08/22  4:20 PM  Result Value Ref Range   Alcohol, Ethyl (B) <10 <10 mg/dL    Comment: (NOTE) Lowest detectable limit for serum alcohol is 10 mg/dL.  For medical purposes only. Performed at [facility], [address] [place], [place] [address]   Lactic acid, plasma     Status: Abnormal   Collection Time: 05/08/22  4:20 PM  Result Value Ref Range   Lactic Acid, Venous 3.2 (HH) 0.5 - 1.9 mmol/L    Comment: CRITICAL RESULT CALLED TO, READ BACK BY AND VERIFIED WITH DANIEL K.  ON 05/08/22 BY Blima Ledger C Performed at [facility], [address] [place], [place] [address]   Protime-INR     Status: Abnormal   Collection Time: 05/08/22  4:20 PM  Result Value Ref Range   Prothrombin Time 16.0 (H) 11.4 - 15.2 seconds    INR 1.3 (H) 0.8 - 1.2    Comment: (NOTE) INR goal varies based on device and disease states. Performed at [facility], [address] [place], [place] [address]   Sample to Blood Bank     Status: None   Collection Time: 05/08/22  4:21 PM  Result Value Ref Range   Blood Bank Specimen SAMPLE AVAILABLE FOR TESTING    Sample Expiration      05/11/2022,2359 Performed at [facility], [address], [place], [place] [address]   I-Stat Chem 8, ED     Status: Abnormal   Collection Time: 05/08/22  5:11 PM  Result Value Ref Range   Sodium 136 135 - 145 mmol/L   Potassium 2.9 (L) 3.5 - 5.1 mmol/L   Chloride 94 (L) 98 - 111 mmol/L   BUN 12 8 - 23 mg/dL   Creatinine, Ser 1.30 0.44 - 1.00 mg/dL   Glucose, Bld 865 (H) 70 - 99 mg/dL    Comment: Glucose reference range applies only to samples taken after fasting for at least 8 hours.   Calcium, Ion 1.11 (L) 1.15 - 1.40 mmol/L   TCO2 29 22 - 32 mmol/L   Hemoglobin 9.5 (L) 12.0 - 15.0 g/dL   HCT 78.4 (L) 69.6 - 29.5 %  Urinalysis, Routine w reflex microscopic     Status: None   Collection Time: 05/08/22  7:03 PM  Result Value Ref Range   Color, Urine YELLOW YELLOW   APPearance CLEAR CLEAR   Specific Gravity, Urine <=1.005 1.005 - 1.030   pH 6.0 5.0 - 8.0   Glucose, UA NEGATIVE NEGATIVE mg/dL   Hgb urine dipstick NEGATIVE NEGATIVE   Bilirubin Urine NEGATIVE NEGATIVE   Ketones, ur NEGATIVE NEGATIVE mg/dL   Protein, ur NEGATIVE NEGATIVE mg/dL   Nitrite NEGATIVE NEGATIVE   Leukocytes,Ua NEGATIVE NEGATIVE    Comment: Performed at [facility], [address] [place], [place] [address]  CBC     Status: Abnormal   Collection Time: 05/08/22  8:53 PM  Result Value Ref Range   WBC 12.7 (H) 4.0 - 10.5 K/uL   RBC 3.11 (L) 3.87 - 5.11 MIL/uL   Hemoglobin 8.8 (L) 12.0 - 15.0 g/dL   HCT 56.3 (L) 87.5 - 64.3 %   MCV 86.2 80.0 - 100.0 fL   MCH 28.3 26.0 - 34.0 pg   MCHC 32.8 30.0 - 36.0  g/dL   RDW 32.9 51.8 - 84.1 %   Platelets 179 150 - 400 K/uL   nRBC 0.0 0.0 - 0.2 %    Comment: Performed at [facility], [address] [place], [place] [address]  Surgical PCR screen     Status: None   Collection Time: 05/09/22  1:39 AM   Specimen: Nasal Mucosa; Nasal Swab  Result Value Ref Range   MRSA, PCR NEGATIVE NEGATIVE   Staphylococcus aureus NEGATIVE NEGATIVE    Comment: (NOTE) The Xpert SA Assay (FDA approved for NASAL specimens in patients 43 years of age and older), is one component of a comprehensive surveillance program. It is not intended to diagnose infection nor to guide or monitor treatment. Performed at [facility], [address] [place], [place] [address]   ABO/Rh     Status: None (Preliminary result)   Collection Time: 05/09/22  1:58 AM  Result Value Ref Range   ABO/RH(D) PENDING   CBC     Status: Abnormal   Collection Time: 05/09/22  1:59 AM  Result Value Ref Range   WBC 12.9 (H) 4.0 - 10.5 K/uL   RBC 2.80 (L) 3.87 - 5.11 MIL/uL   Hemoglobin 7.9 (L) 12.0 - 15.0 g/dL   HCT 09.3 (L) 23.5 - 57.3 %   MCV 87.5 80.0 - 100.0 fL   MCH 28.2 26.0 - 34.0 pg   MCHC 32.2 30.0 - 36.0 g/dL   RDW 22.0 25.4 - 27.0 %   Platelets 164 150 - 400 K/uL   nRBC 0.0 0.0 - 0.2 %    Comment: Performed at [facility], [address] [place], [place] [address]  Basic metabolic panel     Status: Abnormal   Collection Time: 05/09/22  1:59 AM  Result Value Ref Range   Sodium 135 135 - 145 mmol/L   Potassium 3.4 (L) 3.5 - 5.1 mmol/L   Chloride 99 98 - 111 mmol/L   CO2 28 22 - 32 mmol/L   Glucose, Bld 171 (H) 70 - 99 mg/dL    Comment: Glucose reference range applies only to samples taken after fasting for at least 8 hours.   BUN 14 8 - 23 mg/dL   Creatinine, Ser 2.83 0.44 - 1.00 mg/dL   Calcium 8.5 (L) 8.9 - 10.3 mg/dL   GFR, Estimated >15 >17 mL/min    Comment: (NOTE) Calculated using the CKD-EPI Creatinine Equation (2021)    Anion gap 8 5 - 15     Comment: Performed at [facility], [address], [place], [place] [address]    CT CHEST ABDOMEN PELVIS W CONTRAST  Result Date: 05/08/2022 CLINICAL DATA:  Trauma, fall EXAM: CT CHEST, ABDOMEN, AND PELVIS WITH CONTRAST TECHNIQUE: Multidetector CT imaging of the chest, abdomen and pelvis was performed following the standard protocol during bolus administration of intravenous contrast. RADIATION DOSE REDUCTION: This exam was performed according to the departmental dose-optimization program which includes automated exposure  control, adjustment of the mA and/or kV according to patient size and/or use of iterative reconstruction technique. CONTRAST:  OMNIPAQUE IOHEXOL 300 MG/ML  SOLN COMPARISON:  None Available. FINDINGS: CT CHEST FINDINGS Cardiovascular: Aortic atherosclerosis. Normal heart size. Left coronary artery calcification. No pericardial effusion. Mediastinum/Nodes: No enlarged mediastinal, hilar, or axillary lymph nodes. Thyroid gland, trachea, and esophagus demonstrate no significant findings. Lungs/Pleura: Benign calcified nodule of the left apex. Mild centrilobular emphysema. Scarring and or atelectasis of the bilateral lung bases. No pleural effusion or pneumothorax. Musculoskeletal: No chest wall abnormality. No acute osseous findings. Callused, nonacute fractures of the bilateral lower ribs (series 6, image 110). CT ABDOMEN PELVIS FINDINGS Hepatobiliary: No solid liver abnormality is seen. No gallstones, gallbladder wall thickening, or biliary dilatation. Pancreas: Unremarkable. No pancreatic ductal dilatation or surrounding inflammatory changes. Spleen: Normal in size. Chronic scarring and calcification of the spleen. Adrenals/Urinary Tract: Adrenal glands are unremarkable. Kidneys are normal, without renal calculi, solid lesion, or hydronephrosis. The bladder is grossly intact, however there is extensive fluid and stranding in the adjacent space of Retzius as well as small volume free  fluid in the adjacent low pelvis (series 4, image 95, series 7, image 87). Stomach/Bowel: Stomach is within normal limits. Appendix is not clearly visualized. No evidence of bowel wall thickening, distention, or inflammatory changes. Vascular/Lymphatic: Aortic atherosclerosis. No enlarged abdominal or pelvic lymph nodes. Reproductive: Status post hysterectomy. Other: No abdominal wall hernia or abnormality. No ascites. Musculoskeletal: Comminuted, displaced fractures of the left superior and inferior pubic rami as well as the pubic symphysis. Large adjacent hematoma and probable active arterial extravasation at the superior aspect of the pubic symphysis, measuring proximally 4.1 x 3.6 cm (series 4, image 100). Chronic appearing fracture deformities of the right pubic rami. Probable nondisplaced fractures of the bilateral sacral ala, of uncertain acuity (series 4, image 89). Superficial soft tissue edema and contusion overlying the left hip (series 4, image 108). Subtle inferior endplate deformity of T11, age indeterminate (series 7, image 50). IMPRESSION: 1. Comminuted, displaced fractures of the left superior and inferior pubic rami as well as the pubic symphysis. 2. Large adjacent hematoma and probable active arterial extravasation at the superior aspect of the left pubic symphysis. 3. The bladder is grossly intact, however there is extensive fluid and stranding in the adjacent space of Retzius as well as small volume free fluid in the adjacent low pelvis. Findings are concerning for bladder injury. Consider CT cystogram. 4. Probable nondisplaced fractures of the bilateral sacral ala, of uncertain acuity. 5. Chronic appearing fracture deformities of the right pubic rami. 6. Subtle inferior endplate deformity of T11, age indeterminate. 7. Superficial soft tissue contusion overlying the left hip. 8. Coronary artery disease. These results were called by telephone at the time of interpretation on 05/08/2022 at 4:24 pm  to Dr. Gerhard Munch , who verbally acknowledged these results. Aortic Atherosclerosis (ICD10-I70.0) and Emphysema (ICD10-J43.9). Electronically Signed   By: Jearld Lesch M.D.   On: 05/08/2022 16:36   CT HEAD WO CONTRAST  Result Date: 05/08/2022 CLINICAL DATA:  Fall this morning.  Left hip pain. EXAM: CT HEAD WITHOUT CONTRAST CT CERVICAL SPINE WITHOUT CONTRAST TECHNIQUE: Multidetector CT imaging of the head and cervical spine was performed following the standard protocol without intravenous contrast. Multiplanar CT image reconstructions of the cervical spine were also generated. RADIATION DOSE REDUCTION: This exam was performed according to the departmental dose-optimization program which includes automated exposure control, adjustment of the mA and/or kV according to patient size  and/or use of iterative reconstruction technique. COMPARISON:  10/18/2021. FINDINGS: CT HEAD FINDINGS Brain: No evidence of acute infarction, hemorrhage, hydrocephalus, extra-axial collection or mass lesion/mass effect. Vascular: No hyperdense vessel or unexpected calcification. Skull: Normal. Negative for fracture or focal lesion. Sinuses/Orbits: Globes and orbits are unremarkable. Chronic opacification of the right maxillary sinus. Other: None. CT CERVICAL SPINE FINDINGS Alignment: Slight kyphosis, apex C4, unchanged. Minimal anterolisthesis C3 and C4, also stable, degenerative in origin. Skull base and vertebrae: No acute fracture. No primary bone lesion or focal pathologic process. Soft tissues and spinal canal: No prevertebral fluid or swelling. No visible canal hematoma. Disc levels: Mild loss of disc height at C2-C3. Moderate loss of disc height at C3-C4. Marked loss of disc height at C4-C5, C5-C6 and C6-C7. Disc bulging most evident at C3-C4 through C6-C7. Facet degenerative changes. No convincing disc herniation. No change from the prior CT. Upper chest: No acute findings. Other: None. IMPRESSION: HEAD CT 1. No acute  intracranial abnormalities. CERVICAL CT 1. No fracture or acute finding. 2. Advanced degenerative changes, stable from the prior study. Electronically Signed   By: Amie Portlandavid  Ormond M.D.   On: 05/08/2022 16:17   CT CERVICAL SPINE WO CONTRAST  Result Date: 05/08/2022 CLINICAL DATA:  Fall this morning.  Left hip pain. EXAM: CT HEAD WITHOUT CONTRAST CT CERVICAL SPINE WITHOUT CONTRAST TECHNIQUE: Multidetector CT imaging of the head and cervical spine was performed following the standard protocol without intravenous contrast. Multiplanar CT image reconstructions of the cervical spine were also generated. RADIATION DOSE REDUCTION: This exam was performed according to the departmental dose-optimization program which includes automated exposure control, adjustment of the mA and/or kV according to patient size and/or use of iterative reconstruction technique. COMPARISON:  10/18/2021. FINDINGS: CT HEAD FINDINGS Brain: No evidence of acute infarction, hemorrhage, hydrocephalus, extra-axial collection or mass lesion/mass effect. Vascular: No hyperdense vessel or unexpected calcification. Skull: Normal. Negative for fracture or focal lesion. Sinuses/Orbits: Globes and orbits are unremarkable. Chronic opacification of the right maxillary sinus. Other: None. CT CERVICAL SPINE FINDINGS Alignment: Slight kyphosis, apex C4, unchanged. Minimal anterolisthesis C3 and C4, also stable, degenerative in origin. Skull base and vertebrae: No acute fracture. No primary bone lesion or focal pathologic process. Soft tissues and spinal canal: No prevertebral fluid or swelling. No visible canal hematoma. Disc levels: Mild loss of disc height at C2-C3. Moderate loss of disc height at C3-C4. Marked loss of disc height at C4-C5, C5-C6 and C6-C7. Disc bulging most evident at C3-C4 through C6-C7. Facet degenerative changes. No convincing disc herniation. No change from the prior CT. Upper chest: No acute findings. Other: None. IMPRESSION: HEAD CT 1. No  acute intracranial abnormalities. CERVICAL CT 1. No fracture or acute finding. 2. Advanced degenerative changes, stable from the prior study. Electronically Signed   By: Amie Portlandavid  Ormond M.D.   On: 05/08/2022 16:17   DG Chest Port 1 View  Result Date: 05/08/2022 CLINICAL DATA:  Fall.  On blood thinners. EXAM: PORTABLE CHEST 1 VIEW COMPARISON:  10/18/2021 FINDINGS: Left humeral shaft nonacute fracture with possible underlying osseous lesion and prominent callus deposition. New since 10/18/2021. Osteopenia. Midline trachea. Normal heart size for level of inspiration. Atherosclerosis in the transverse aorta. No pleural effusion or pneumothorax. Left upper lobe calcified granuloma of 1.0 cm. Clear right lung. Left clavicular foreshortening is similar and possibly postoperative. Osteopenia. Possible lower left anterior rib fractures. IMPRESSION: Osteopenia with possible lower left rib fractures. No pleural fluid or pneumothorax. CT is pending. Interval left humeral shaft  nonacute fracture with suggestion of underlying osseous lesion. Recommend dedicated humerus radiographs. Aortic Atherosclerosis (ICD10-I70.0). Electronically Signed   By: Jeronimo Greaves M.D.   On: 05/08/2022 15:23   CT Hip Left Wo Contrast  Result Date: 05/08/2022 CLINICAL DATA:  Hip pain status post fall. EXAM: CT OF THE LEFT HIP WITHOUT CONTRAST TECHNIQUE: Multidetector CT imaging of the left hip was performed according to the standard protocol. Multiplanar CT image reconstructions were also generated. RADIATION DOSE REDUCTION: This exam was performed according to the departmental dose-optimization program which includes automated exposure control, adjustment of the mA and/or kV according to patient size and/or use of iterative reconstruction technique. COMPARISON:  Radiographs same date. No older relevant comparison studies. FINDINGS: Bones/Joint/Cartilage Asymmetric moderate left hip osteoarthritis with ring osteophytes likely accounting for the  sclerosis on earlier radiographs. There is no evidence of acute left femoral neck fracture or dislocation. However, there are comminuted and mildly displaced fractures of the left superior and inferior pubic rami. In addition, there is evidence of old posttraumatic deformities of both parasymphyseal regions. No diastasis of the sacroiliac joints or symphysis pubis identified. Ligaments Suboptimally assessed by CT. Muscles and Tendons Moderate size left pelvic sidewall hematoma with extraperitoneal left perivesical extension measuring up to 5.8 x 5.6 cm on image 26/3. Resulting mass effect on the bladder. No significant hematoma within the visualized proximal left thigh. Soft tissues As above, extraperitoneal hematoma on the left related to the pubic rami fractures. There is mass effect on the bladder without definite evidence of bladder injury. No significant pelvic ascites. Subcutaneous edema is present laterally within the left proximal thigh. Iliofemoral atherosclerosis noted. IMPRESSION: 1. Acute mildly displaced fractures of the left superior and inferior pubic rami with associated extraperitoneal left pelvic hematoma. This hematoma exerts mass effect on the bladder, without definite evidence of bladder injury. The bladder is incompletely visualized by this examination. Correlate clinically for presence of hematuria which may prompt need for further imaging. 2. No evidence of proximal femur fracture or dislocation. 3. Moderate asymmetric left hip osteoarthritic changes. Electronically Signed   By: Carey Bullocks M.D.   On: 05/08/2022 14:12   DG Hip Unilat With Pelvis 2-3 Views Left  Result Date: 05/08/2022 CLINICAL DATA:  Trauma, fall EXAM: DG HIP (WITH OR WITHOUT PELVIS) 2-3V LEFT COMPARISON:  None Available. FINDINGS: No displaced fracture or dislocation is seen. There is faint transverse sclerosis in subcapital portion of neck of left femur. There is questionable minimal offset in alignment of cortical  margin along the medial aspect of subcapital portion of neck. Degenerative changes are noted in visualized lower lumbar spine and SI joints. There is soft tissue swelling lateral to the left hip. IMPRESSION: No displaced fracture or dislocation is seen. There is faint transverse sclerosis in the subcapital portion of neck of left femur which may be an artifact due to confluence of posterior margin of the acetabulum or suggest impacted fracture. If clinically warranted, follow-up CT may be considered. Lumbar spondylosis. Electronically Signed   By: Ernie Avena M.D.   On: 05/08/2022 13:01    Review of Systems  HENT:  Negative for ear discharge, ear pain, hearing loss and tinnitus.   Eyes:  Negative for photophobia and pain.  Respiratory:  Negative for cough and shortness of breath.   Cardiovascular:  Negative for chest pain.  Gastrointestinal:  Negative for abdominal pain, nausea and vomiting.  Genitourinary:  Negative for dysuria, flank pain, frequency and urgency.  Musculoskeletal:  Positive for arthralgias (Pelvis). Negative  for back pain, myalgias and neck pain.  Neurological:  Negative for dizziness and headaches.  Hematological:  Does not bruise/bleed easily.  Psychiatric/Behavioral:  The patient is not nervous/anxious.    Blood pressure (!) 96/54, pulse 82, temperature 97.6 F (36.4 C), temperature source Oral, resp. rate 15, height 5\' 4"  (1.626 m), weight 67 kg, SpO2 93 %. Physical Exam Constitutional:      General: She is not in acute distress.    Appearance: She is well-developed. She is not diaphoretic.  HENT:     Head: Normocephalic and atraumatic.  Eyes:     General: No scleral icterus.       Right eye: No discharge.        Left eye: No discharge.     Conjunctiva/sclera: Conjunctivae normal.  Cardiovascular:     Rate and Rhythm: Normal rate. Rhythm irregular.  Pulmonary:     Effort: Pulmonary effort is normal. No respiratory distress.  Musculoskeletal:     Cervical  back: Normal range of motion.     Comments: Pelvis--no traumatic wounds or rash, no ecchymosis, stable to manual stress, mild pain with lateral compression  BLE No traumatic wounds, ecchymosis, or rash  Nontender  No knee or ankle effusion  Knee stable to varus/ valgus and anterior/posterior stress  Sens DPN, SPN, TN intact  Motor EHL, ext, flex, evers 5/5  DP 2+, PT 0, No significant edema  Skin:    General: Skin is warm and dry.  Neurological:     Mental Status: She is alert.  Psychiatric:        Mood and Affect: Mood normal.        Behavior: Behavior normal.     Assessment/Plan: Pelvic fxs -- Plan to treat non-operatively with WBAT BLE. F/u with Dr. in 3 weeks. Multiple medical problems including A fib on eliquis (last dose 9/5 in PM), HTN, and hypothyroidism -- per primary service    Eulah Pont, PA-C Orthopedic Surgery [phone] 05/09/2022, 10:39 AM

## 2022-05-10 LAB — CBC
HCT: 21.2 % — ABNORMAL LOW (ref 36.0–46.0)
HCT: 24 % — ABNORMAL LOW (ref 36.0–46.0)
Hemoglobin: 7.2 g/dL — ABNORMAL LOW (ref 12.0–15.0)
Hemoglobin: 7.7 g/dL — ABNORMAL LOW (ref 12.0–15.0)
MCH: 29.6 pg (ref 26.0–34.0)
MCH: 29.1 pg (ref 26.0–34.0)
MCHC: 34 g/dL (ref 30.0–36.0)
MCHC: 32.1 g/dL (ref 30.0–36.0)
MCV: 87.2 fL (ref 80.0–100.0)
MCV: 90.6 fL (ref 80.0–100.0)
Platelets: 122 10*3/uL — ABNORMAL LOW (ref 150–400)
Platelets: 136 10*3/uL — ABNORMAL LOW (ref 150–400)
RBC: 2.43 MIL/uL — ABNORMAL LOW (ref 3.87–5.11)
RBC: 2.65 MIL/uL — ABNORMAL LOW (ref 3.87–5.11)
RDW: 14.9 % (ref 11.5–15.5)
RDW: 15.1 % (ref 11.5–15.5)
WBC: 11.4 10*3/uL — ABNORMAL HIGH (ref 4.0–10.5)
WBC: 11.2 10*3/uL — ABNORMAL HIGH (ref 4.0–10.5)
nRBC: 0 % (ref 0.0–0.2)
nRBC: 0 % (ref 0.0–0.2)

## 2022-05-10 LAB — TYPE AND SCREEN
ABO/RH(D): O POS
Antibody Screen: NEGATIVE
Unit division: 0

## 2022-05-10 LAB — BPAM RBC
Blood Product Expiration Date: 202310052359
ISSUE DATE / TIME: 202309071237
Unit Type and Rh: 5100

## 2022-05-10 LAB — BASIC METABOLIC PANEL
Anion gap: 9 (ref 5–15)
BUN: 11 mg/dL (ref 8–23)
CO2: 25 mmol/L (ref 22–32)
Calcium: 8.3 mg/dL — ABNORMAL LOW (ref 8.9–10.3)
Chloride: 103 mmol/L (ref 98–111)
Creatinine, Ser: 0.72 mg/dL (ref 0.44–1.00)
GFR, Estimated: >60 mL/min (ref >60)
Glucose, Bld: 111 mg/dL — ABNORMAL HIGH (ref 70–99)
Potassium: 3.6 mmol/L (ref 3.5–5.1)
Sodium: 137 mmol/L (ref 135–145)

## 2022-05-10 MED ORDER — METHOCARBAMOL 750 MG PO TABS
750.0000 mg | ORAL_TABLET | Freq: Four times a day (QID) | ORAL | Status: DC
  Administered 2022-05-10 – 2022-05-16 (×24): 750 mg via ORAL
  Filled 2022-05-10 (×24): qty 1

## 2022-05-10 MED ORDER — SODIUM CHLORIDE 0.9 % IV SOLN: 25.0000 mg | Freq: Once | INTRAVENOUS | Status: DC

## 2022-05-10 MED ORDER — POLYETHYLENE GLYCOL 3350 17 G PO PACK
17.0000 g | PACK | Freq: Every day | ORAL | Status: DC
  Administered 2022-05-11 – 2022-05-12 (×2): 17 g via ORAL
  Filled 2022-05-10 (×2): qty 1

## 2022-05-10 MED ORDER — SENNA 8.6 MG PO TABS
2.0000 | ORAL_TABLET | Freq: Once | ORAL | Status: AC
Start: 2022-05-10 — End: 2022-05-10
  Administered 2022-05-10: 17.2 mg via ORAL
  Filled 2022-05-10: qty 2

## 2022-05-10 MED ORDER — ACETAMINOPHEN 500 MG PO TABS
1000.0000 mg | ORAL_TABLET | Freq: Four times a day (QID) | ORAL | Status: DC
  Administered 2022-05-10 – 2022-05-16 (×22): 1000 mg via ORAL
  Filled 2022-05-10 (×22): qty 2

## 2022-05-10 MED ORDER — FERROUS SULFATE 75 (15 FE) MG/ML PO SOLN
45.0000 mg | Freq: Three times a day (TID) | ORAL | Status: DC
  Administered 2022-05-10: 45 mg via ORAL
  Filled 2022-05-10: qty 3

## 2022-05-10 MED ORDER — MAGNESIUM HYDROXIDE 400 MG/5ML PO SUSP
30.0000 mL | Freq: Once | ORAL | Status: AC
  Administered 2022-05-10: 30 mL via ORAL
  Filled 2022-05-10: qty 30

## 2022-05-10 MED ORDER — CALCIUM CARBONATE ANTACID 500 MG PO CHEW
2.0000 | CHEWABLE_TABLET | ORAL | Status: DC | PRN
  Administered 2022-05-10 – 2022-05-11 (×2): 400 mg via ORAL
  Filled 2022-05-10 (×2): qty 2

## 2022-05-10 MED ORDER — SODIUM CHLORIDE 0.9 % IV SOLN: 250.0000 mg | Freq: Once | INTRAVENOUS | Status: DC

## 2022-05-10 MED ORDER — FERROUS SULFATE 325 (65 FE) MG PO TABS
325.0000 mg | ORAL_TABLET | Freq: Three times a day (TID) | ORAL | Status: DC
Start: 2022-05-10 — End: 2022-05-16
  Administered 2022-05-10 – 2022-05-12 (×2): 325 mg via ORAL
  Filled 2022-05-10 (×11): qty 1

## 2022-05-10 MED ORDER — SODIUM CHLORIDE 0.9 % IV SOLN
250.0000 mg | Freq: Once | INTRAVENOUS | Status: AC
  Administered 2022-05-10: 250 mg via INTRAVENOUS
  Filled 2022-05-10: qty 20

## 2022-05-10 NOTE — Progress Notes (Signed)
Trauma/Critical Care Follow Up Note  Subjective:    Overnight Issues:   Objective:  Vital signs for last 24 hours: Temp:  [97.2 F (36.2 C)-98.3 F (36.8 C)] 97.6 F (36.4 C) (09/08 0800) Pulse Rate:  [76-112] 98 (09/08 0900) Resp:  [13-29] 15 (09/08 0900) BP: (82-124)/(47-82) 102/64 (09/08 0900) SpO2:  [88 %-100 %] 93 % (09/08 0900)  Hemodynamic parameters for last 24 hours:    Intake/Output from previous day: 09/07 0701 - 09/08 0700 In: 1951.1 [P.O.:480; I.V.:1156.1; Blood:315] Out: 1025 [Urine:1025]  Intake/Output this shift: Total I/O In: 414.1 [P.O.:300; I.V.:114.1] Out: -   Vent settings for last 24 hours:    Physical Exam:  Gen: comfortable, no distress Neuro: non-focal exam HEENT: PERRL Neck: supple CV: RRR Pulm: unlabored breathing Abd: soft, NT GU: clear yellow urine Extr: wwp, no edema   Results for orders placed or performed during the hospital encounter of 05/08/22 (from the past 24 hour(s))  CBC     Status: Abnormal   Collection Time: 05/09/22  4:14 PM  Result Value Ref Range   WBC 17.5 (H) 4.0 - 10.5 K/uL   RBC 2.81 (L) 3.87 - 5.11 MIL/uL   Hemoglobin 8.3 (L) 12.0 - 15.0 g/dL   HCT 62.1 (L) 30.8 - 65.7 %   MCV 89.3 80.0 - 100.0 fL   MCH 29.5 26.0 - 34.0 pg   MCHC 33.1 30.0 - 36.0 g/dL   RDW 84.6 96.2 - 95.2 %   Platelets 131 (L) 150 - 400 K/uL   nRBC 0.0 0.0 - 0.2 %  CBC     Status: Abnormal   Collection Time: 05/09/22  7:27 PM  Result Value Ref Range   WBC 15.7 (H) 4.0 - 10.5 K/uL   RBC 2.65 (L) 3.87 - 5.11 MIL/uL   Hemoglobin 7.8 (L) 12.0 - 15.0 g/dL   HCT 84.1 (L) 32.4 - 40.1 %   MCV 88.7 80.0 - 100.0 fL   MCH 29.4 26.0 - 34.0 pg   MCHC 33.2 30.0 - 36.0 g/dL   RDW 02.7 25.3 - 66.4 %   Platelets 129 (L) 150 - 400 K/uL   nRBC 0.0 0.0 - 0.2 %  CBC     Status: Abnormal   Collection Time: 05/10/22  1:43 AM  Result Value Ref Range   WBC 11.4 (H) 4.0 - 10.5 K/uL   RBC 2.43 (L) 3.87 - 5.11 MIL/uL   Hemoglobin 7.2 (L) 12.0 -  15.0 g/dL   HCT 40.3 (L) 47.4 - 25.9 %   MCV 87.2 80.0 - 100.0 fL   MCH 29.6 26.0 - 34.0 pg   MCHC 34.0 30.0 - 36.0 g/dL   RDW 56.3 87.5 - 64.3 %   Platelets 122 (L) 150 - 400 K/uL   nRBC 0.0 0.0 - 0.2 %  CBC     Status: Abnormal   Collection Time: 05/10/22  7:38 AM  Result Value Ref Range   WBC 11.2 (H) 4.0 - 10.5 K/uL   RBC 2.65 (L) 3.87 - 5.11 MIL/uL   Hemoglobin 7.7 (L) 12.0 - 15.0 g/dL   HCT 32.9 (L) 51.8 - 84.1 %   MCV 90.6 80.0 - 100.0 fL   MCH 29.1 26.0 - 34.0 pg   MCHC 32.1 30.0 - 36.0 g/dL   RDW 66.0 63.0 - 16.0 %   Platelets 136 (L) 150 - 400 K/uL   nRBC 0.0 0.0 - 0.2 %    Assessment & Plan: The plan of care was  discussed with the bedside nurse for the day, who is in agreement with this plan and no additional concerns were raised.   Present on Admission:  Multiple pelvic fractures (HCC)  Fall    LOS: 2 days   Additional comments:I reviewed the patient's new clinical lab test results.   and I reviewed the patients new imaging test results.    Fall Left superior/inferior pubic rami fxs - Ortho Traum[facility] see. NWB BLE until they make recommendations Extraperitoneal left pelvic hematoma causing mass effect on bladder - foley in place with pelvic FX, urine clear. Ortho Trauma consult P ABL anemia - Hgb down to 7.2, rec'd 1u pRBC yest, trend BP and hgb, no txf today as hemodynamic normal, de-escalate to daily CBC and d/c MIVF Left elbow skin tear - local wound care A fib on eliquis - eliquis reversed and hold on admission HTN - home meds Hypothyroidism - home synthroid Depression - home zoloft ID - none indicated VTE - SCDs only for now, hold eliquis, hold LMWH until hgb stable FEN - d/c MIVF, reg diet Foley - none Dispo - 4NP, Lives alone. 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   Critical Care Total Time: 35 minutes  Diamantina Monks, MD Trauma & General Surgery Please use [email or web address] to contact on call provider  05/10/2022  *Care during the  described time interval was provided by me. I have reviewed this patient's available data, including medical history, events of note, physical examination and test results as part of my evaluation.

## 2022-05-10 NOTE — Progress Notes (Signed)
Pt transferred to 4NP08 from 4NICU32. Pt alert and oriented x 4, currently not having any pain. Vitals taken and full assessment completed.   Belongings include: Clothes Shoes  Robina Ade, RN

## 2022-05-10 NOTE — Progress Notes (Signed)
Physical Therapy Treatment Patient Details Name: Maria [name] MRN: [id number] DOB: 02-Feb-1934 Today's Date: 05/10/2022   History of Present Illness 88 yo female presenting to ED on 9/6 after fall at home. Imaging showing Acute mildly displaced fractures of the left superior and inferior pubic rami with associated extraperitoneal left pelvic hematoma. Per ortho consult, non-operative treatment and WBAT BLEs. PMH including a-fib, HTN, hypothyroid, and abdominal hysterectomy.    PT Comments    The pt was agreeable to session despite increased pain at this time. The pt required increased assist to manage bed mobility and sit-stand transfers at this time due to elevated pain levels. Poor anterior wt shift with sit-stand attempts requiring modA to power up to standing and maintain balance. Unable to progress ambulation distance at this time due to fatigue and pain, will continue to benefit from skilled PT acutely as well as following d/c to return to independence.     Recommendations for follow up therapy are one component of a multi-disciplinary discharge planning process, led by the attending physician.  Recommendations may be updated based on patient status, additional functional criteria and insurance authorization.  Follow Up Recommendations  Acute inpatient rehab (3hours/day)     Assistance Recommended at Discharge Frequent or constant Supervision/Assistance  Patient can return home with the following A lot of help with walking and/or transfers;A lot of help with bathing/dressing/bathroom;Assistance with feeding;Assistance with cooking/housework;Direct supervision/assist for medications management;Direct supervision/assist for financial management;Help with stairs or ramp for entrance   Equipment Recommendations  None recommended by PT    Recommendations for Other Services       Precautions / Restrictions Precautions Precautions: Fall Precaution Comments: watch BP Restrictions Weight  Bearing Restrictions: Yes RLE Weight Bearing: Weight bearing as tolerated LLE Weight Bearing: Weight bearing as tolerated     Mobility  Bed Mobility Overal bed mobility: Needs Assistance Bed Mobility: Supine to Sit, Sit to Supine     Supine to sit: HOB elevated, Mod assist Sit to supine: Mod assist, +2 for physical assistance   General bed mobility comments: modA to complete transition to EOB, more pain limited today. modA of 2 to return to bed due to pain    Transfers Overall transfer level: Needs assistance Equipment used: Rolling walker (2 wheels) Transfers: Sit to/from Stand, Bed to chair/wheelchair/BSC Sit to Stand: From elevated surface, +2 safety/equipment, Mod assist Stand pivot transfers: Mod assist, Max assist, +2 physical assistance         General transfer comment: modA initially to move towards R side to [facility], then maxA to pivot back to bed due to increased pain. modA to rise due to poor trunk flexion and anterior movement. leaning posteriorly and needing PT assist to maintain upright    Ambulation/Gait               General Gait Details: pt unable to manage more than 1-2 small steps to R today due to pain      Balance Overall balance assessment: Needs assistance Sitting-balance support: No upper extremity supported, Feet supported Sitting balance-Leahy Scale: Fair     Standing balance support: Bilateral upper extremity supported, During functional activity Standing balance-Leahy Scale: Poor Standing balance comment: mod-maxA of 2                            Cognition Arousal/Alertness: Awake/alert Behavior During Therapy: WFL for tasks assessed/performed Overall Cognitive Status: Impaired/Different from baseline Area of Impairment: Awareness, Problem solving  Awareness: Emergent Problem Solving: Decreased initiation, Difficulty sequencing, Requires verbal cues General Comments: pt is a retired Charity fundraiser  but not asking for pain medication throughout day then unable to move due to pain with poor insight to planning ahead. unable to use remotes to manage bed and TV even after instruction        Exercises      General Comments General comments (skin integrity, edema, etc.): VSS on RA      Pertinent Vitals/Pain Pain Assessment Pain Assessment: Faces Faces Pain Scale: Hurts whole lot Pain Location: LLE Pain Descriptors / Indicators: Discomfort, Grimacing Pain Intervention(s): Limited activity within patient's tolerance, Monitored during session, Repositioned, RN gave pain meds during session    Home Living                          Prior Function            PT Goals (current goals can now be found in the care plan section) Acute Rehab PT Goals Patient Stated Goal: return home and to independence PT Goal Formulation: With patient Time For Goal Achievement: 05/23/22 Potential to Achieve Goals: Good Progress towards PT goals: Progressing toward goals    Frequency    Min 4X/week      PT Plan Current plan remains appropriate    Co-evaluation              AM-PAC PT "6 Clicks" Mobility   Outcome Measure  Help needed turning from your back to your side while in a flat bed without using bedrails?: A Little Help needed moving from lying on your back to sitting on the side of a flat bed without using bedrails?: A Little Help needed moving to and from a bed to a chair (including a wheelchair)?: A Lot Help needed standing up from a chair using your arms (e.g., wheelchair or bedside chair)?: A Lot Help needed to walk in hospital room?: Total Help needed climbing 3-5 steps with a railing? : Total 6 Click Score: 12    End of Session Equipment Utilized During Treatment: Gait belt Activity Tolerance: Patient limited by pain Patient left: in bed;with call bell/phone within reach;with bed alarm set Nurse Communication: Mobility status PT Visit Diagnosis:  Unsteadiness on feet (R26.81);Other abnormalities of gait and mobility (R26.89);Muscle weakness (generalized) (M62.81);History of falling (Z91.81)     Time: 5284-1324 PT Time Calculation (min) (ACUTE ONLY): 25 min  Charges:  $Therapeutic Exercise: 8-22 mins $Therapeutic Activity: 8-22 mins                     Vickki Muff, PT, DPT   Acute Rehabilitation Department    Ronnie Derby 05/10/2022, 6:08 PM

## 2022-05-10 NOTE — Progress Notes (Signed)
Inpatient Rehabilitation Admissions Coordinator   Discussed with [facility] RN CM, Raynelle Fanning. We do not feel insurance will approve for AIR level rehab. Other AIR level rehabs can be pursued to see if they have different recommendations.  Ottie Glazier, RN, MSN Rehab Admissions Coordinator [phone] 05/10/2022 7:43 PM

## 2022-05-10 NOTE — TOC Initial Note (Signed)
Transition of Care [facility]) - Initial/Assessment Note    Patient Details  Name: Maria [name] MRN: [id number] Date of Birth: May 11, 1934  Transition of Care [facility]) CM/SW Contact:    Glennon Mac, RN Phone Number: 05/10/2022, 11:50am  Clinical Narrative:                 88 yo female presenting to ED on 9/6 after fall at home. Imaging showing Acute mildly displaced fractures of the left superior and inferior pubic rami with associated extraperitoneal left pelvic hematoma. Prior to admission, patient independent and living at home alone.  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  PT/OT recommending inpatient rehab; patient screened by CIR AC.  AC states that insurance will not approve with current diagnosis.  Discussed this with patient, and presented options for discharge, including SNF for rehab and home health follow-up.  Patient declines SNF placement, and states that she has friends who can stay with her at discharge to provide assistance.  Will continue to follow patient progress with therapies; hopeful that she can get to safe home health level prior to discharge.  Expected Discharge Plan: Home w Home Health Services Barriers to Discharge: Continued Medical Work up   Patient Goals and CMS Choice Patient states their goals for this hospitalization and ongoing recovery are:: to go home      Expected Discharge Plan and Services Expected Discharge Plan: Home w Home Health Services   Discharge Planning Services: CM Consult                                          Prior Living Arrangements/Services   Lives with:: Self Patient language and need for interpreter reviewed:: Yes Do you feel safe going back to the place where you live?: Yes      Need for Family Participation in Patient Care: Yes (Comment) Care giver support system in place?: Yes (comment) Current home services: DME Criminal Activity/Legal Involvement Pertinent to  Current Situation/Hospitalization: No - Comment as needed  Activities of Daily Living Home Assistive Devices/Equipment: Eyeglasses, Cane (specify quad or straight), Walker (specify type), Dentures (specify type) (partial/upper) ADL Screening (condition at time of admission) Patient's cognitive ability adequate to safely complete daily activities?: Yes Is the patient deaf or have difficulty hearing?: No Does the patient have difficulty seeing, even when wearing glasses/contacts?: No Does the patient have difficulty concentrating, remembering, or making decisions?: No Patient able to express need for assistance with ADLs?: Yes Does the patient have difficulty dressing or bathing?: Yes Independently performs ADLs?: No Communication: Independent Dressing (OT): Needs assistance Is this a change from baseline?: Change from baseline, expected to last >3 days Grooming: Independent Feeding: Independent Bathing: Needs assistance Is this a change from baseline?: Change from baseline, expected to last >3 days Toileting: Needs assistance Is this a change from baseline?: Change from baseline, expected to last >3days In/Out Bed: Dependent Is this a change from baseline?: Change from baseline, expected to last >3 days Walks in Home: Needs assistance Is this a change from baseline?: Change from baseline, expected to last >3 days Does the patient have difficulty walking or climbing stairs?: Yes Weakness of Legs: Left Weakness of Arms/Hands: None                   Emotional Assessment   Attitude/Demeanor/Rapport: Engaged Affect (typically observed):  Accepting Orientation: : Oriented to Self, Oriented to Place, Oriented to  Time, Oriented to Situation      Admission diagnosis:  Fall [W19.XXXA] Hemorrhage of pelvic artery [R58] Multiple pelvic fractures (HCC) [S32.82XA] Fall, initial encounter [W19.XXXA] Multiple closed fractures of pelvis without disruption of pelvic ring, initial  encounter [facility]) [S32.82XA] Patient Active Problem List   Diagnosis Date Noted   Multiple pelvic fractures (HCC) 05/08/2022   Fall 05/08/2022   Pressure injury of skin 10/20/2021   Primary hypertension    Hypothyroidism    Generalized anxiety disorder    Traumatic rhabdomyolysis (HCC) 10/18/2021   Paroxysmal atrial fibrillation with RVR (HCC) 10/18/2021   Dehydration 10/18/2021   Elevated LFTs 10/18/2021   Falls 10/18/2021   Elevated troponin 10/18/2021   SIRS (systemic inflammatory response syndrome) (HCC) 10/18/2021   PCP:  Lorenda Ishihara, MD Pharmacy:   [facility] - [place], [place] - [address] [place] [address] Phone: [phone] Fax: [phone]     Social Determinants of Health (SDOH) Interventions    Readmission Risk Interventions     No data to display         Quintella Baton, RN, BSN  Trauma/Neuro ICU Case Manager [phone]

## 2022-05-11 LAB — CBC
HCT: 22.9 % — ABNORMAL LOW (ref 36.0–46.0)
Hemoglobin: 7.6 g/dL — ABNORMAL LOW (ref 12.0–15.0)
MCH: 29.8 pg (ref 26.0–34.0)
MCHC: 33.2 g/dL (ref 30.0–36.0)
MCV: 89.8 fL (ref 80.0–100.0)
Platelets: 129 10*3/uL — ABNORMAL LOW (ref 150–400)
RBC: 2.55 MIL/uL — ABNORMAL LOW (ref 3.87–5.11)
RDW: 15.3 % (ref 11.5–15.5)
WBC: 13.6 10*3/uL — ABNORMAL HIGH (ref 4.0–10.5)
nRBC: 0 % (ref 0.0–0.2)

## 2022-05-11 NOTE — Progress Notes (Signed)
   Subjective/Chief Complaint: Pt doing well this AM Hgb stable   Objective: Vital signs in last 24 hours: Temp:  [97.6 F (36.4 C)-98 F (36.7 C)] 98 F (36.7 C) (09/09 0723) Pulse Rate:  [82-98] 90 (09/09 0723) Resp:  [13-25] 21 (09/09 0723) BP: (89-118)/(51-65) 118/65 (09/09 0723) SpO2:  [87 %-100 %] 100 % (09/09 0723) Last BM Date : 05/05/22  Intake/Output from previous day: 09/08 0701 - 09/09 0700 In: 414.1 [P.O.:300; I.V.:114.1] Out: 150 [Urine:150] Intake/Output this shift: No intake/output data recorded.  PE:  Constitutional: No acute distress, conversant, appears states age. Eyes: Anicteric sclerae, moist conjunctiva, no lid lag Lungs: Clear to auscultation bilaterally, normal respiratory effort CV: regular rate and rhythm, no murmurs, no peripheral edema, pedal pulses 2+ GI: Soft, no masses or hepatosplenomegaly, non-tender to palpation Skin: No rashes, palpation reveals normal turgor Psychiatric: appropriate judgment and insight, oriented to person, place, and time   Lab Results:  Recent Labs    05/10/22 0738 05/11/22 0147  WBC 11.2* 13.6*  HGB 7.7* 7.6*  HCT 24.0* 22.9*  PLT 136* 129*   BMET Recent Labs    05/09/22 0159 05/10/22 0738  NA 135 137  K 3.4* 3.6  CL 99 103  CO2 28 25  GLUCOSE 171* 111*  BUN 14 11  CREATININE 0.88 0.72  CALCIUM 8.5* 8.3*   PT/INR Recent Labs    05/08/22 1620  LABPROT 16.0*  INR 1.3*   ABG No results for input(s): "PHART", "HCO3" in the last 72 hours.  Invalid input(s): "PCO2", "PO2"  Studies/Results: No results found.  Anti-infectives: Anti-infectives (From admission, onward)    None       Assessment/Plan: Fall Left superior/inferior pubic rami fxs - Ortho Tra[facility] see. NWB BLE until they make recommendations Extraperitoneal left pelvic hematoma causing mass effect on bladder - foley in place with pelvic FX, urine clear. Ortho Trauma consult P ABL anemia - hgb stable, de-escalate to daily  CBC and d/c MIVF Left elbow skin tear - local wound care A fib on eliquis - eliquis reversed and held on admission HTN - home meds Hypothyroidism - home synthroid Depression - home zoloft ID - none indicated VTE - SCDs only for now, hold eliquis, hold LMWH until hgb stable FEN - d/c MIVF, reg diet Foley - none Dispo - 4NP, Lives alone. 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   LOS: 3 days    Axel Filler 05/11/2022

## 2022-05-12 LAB — CBC
HCT: 22.8 % — ABNORMAL LOW (ref 36.0–46.0)
Hemoglobin: 7.3 g/dL — ABNORMAL LOW (ref 12.0–15.0)
MCH: 29.3 pg (ref 26.0–34.0)
MCHC: 32 g/dL (ref 30.0–36.0)
MCV: 91.6 fL (ref 80.0–100.0)
Platelets: 152 10*3/uL (ref 150–400)
RBC: 2.49 MIL/uL — ABNORMAL LOW (ref 3.87–5.11)
RDW: 15.7 % — ABNORMAL HIGH (ref 11.5–15.5)
WBC: 9.2 10*3/uL (ref 4.0–10.5)
nRBC: 0.2 % (ref 0.0–0.2)

## 2022-05-12 MED ORDER — POLYETHYLENE GLYCOL 3350 17 G PO PACK
17.0000 g | PACK | Freq: Two times a day (BID) | ORAL | Status: DC
  Administered 2022-05-12 – 2022-05-13 (×2): 17 g via ORAL
  Filled 2022-05-12 (×4): qty 1

## 2022-05-12 MED ORDER — DIPHENHYDRAMINE HCL 25 MG PO CAPS
25.0000 mg | ORAL_CAPSULE | Freq: Four times a day (QID) | ORAL | Status: DC | PRN
  Administered 2022-05-12 – 2022-05-14 (×5): 25 mg via ORAL
  Filled 2022-05-12 (×5): qty 1

## 2022-05-12 NOTE — Progress Notes (Signed)
   Subjective/Chief Complaint: NO acute changes   Objective: Vital signs in last 24 hours: Temp:  [97.5 F (36.4 C)-98.3 F (36.8 C)] 98.3 F (36.8 C) (09/10 0657) Pulse Rate:  [85-96] 96 (09/10 0657) Resp:  [16-20] 20 (09/10 0657) BP: (96-117)/(55-71) 99/59 (09/10 0657) SpO2:  [93 %-100 %] 93 % (09/10 0657) Last BM Date : 05/05/22  Intake/Output from previous day: 09/09 0701 - 09/10 0700 In: 1320 [P.O.:1320] Out: 2850 [Urine:2850] Intake/Output this shift: No intake/output data recorded.  PE:   Constitutional: No acute distress, conversant, appears states age. Eyes: Anicteric sclerae, moist conjunctiva, no lid lag Lungs: Clear to auscultation bilaterally, normal respiratory effort CV: regular rate and rhythm, no murmurs, no peripheral edema, pedal pulses 2+ GI: Soft, no masses or hepatosplenomegaly, non-tender to palpation Skin: No rashes, palpation reveals normal turgor Psychiatric: appropriate judgment and insight, oriented to person, place, and time  Lab Results:  Recent Labs    05/11/22 0147 05/12/22 0054  WBC 13.6* 9.2  HGB 7.6* 7.3*  HCT 22.9* 22.8*  PLT 129* 152   BMET Recent Labs    05/10/22 0738  NA 137  K 3.6  CL 103  CO2 25  GLUCOSE 111*  BUN 11  CREATININE 0.72  CALCIUM 8.3*      Fall Left superior/inferior pubic rami fxs - Ortho Tra[facility] see. NWB BLE until they make recommendations Extraperitoneal left pelvic hematoma causing mass effect on bladder - foley in place with pelvic FX, urine clear. 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

## 2022-05-13 LAB — CBC
HCT: 23 % — ABNORMAL LOW (ref 36.0–46.0)
Hemoglobin: 7.6 g/dL — ABNORMAL LOW (ref 12.0–15.0)
MCH: 30 pg (ref 26.0–34.0)
MCHC: 33 g/dL (ref 30.0–36.0)
MCV: 90.9 fL (ref 80.0–100.0)
Platelets: 189 10*3/uL (ref 150–400)
RBC: 2.53 MIL/uL — ABNORMAL LOW (ref 3.87–5.11)
RDW: 16.3 % — ABNORMAL HIGH (ref 11.5–15.5)
WBC: 9.5 10*3/uL (ref 4.0–10.5)
nRBC: 0.2 % (ref 0.0–0.2)

## 2022-05-13 MED ORDER — MAGNESIUM CITRATE PO SOLN
0.5000 | Freq: Once | ORAL | Status: AC
Start: 2022-05-13 — End: 2022-05-13
  Administered 2022-05-13: 0.5 via ORAL
  Filled 2022-05-13: qty 296

## 2022-05-13 MED ORDER — SENNA 8.6 MG PO TABS
1.0000 | ORAL_TABLET | Freq: Every day | ORAL | Status: DC
  Administered 2022-05-13 – 2022-05-16 (×3): 8.6 mg via ORAL
  Filled 2022-05-13 (×4): qty 1

## 2022-05-13 NOTE — TOC Progression Note (Signed)
Transition of Care [facility]) - Progression Note    Patient Details  Name: Maria [name] MRN: [id number] Date of Birth: 07-15-1934  Transition [facility]) CM/SW Contact  Ella Bodo, RN Phone Number: 05/13/2022, 1:50pm  Clinical Narrative:    Met with patient to discuss discharge plans.  Current recommendation is for CIR, but insurance will not cover due to lack of medical necessity.  Patient is not willing to consider short term rehab at a SNF.  She states she has lots of neighbors and friends who are available to help. She will need initial 24h assist if she discharges home, and currently she does not have this in place.  She states she will make some phone calls and see what she can arrange. Will follow progress.     Expected Discharge Plan: [place] Barriers to Discharge: Continued Medical Work up  Expected Discharge Plan and Services Expected Discharge Plan: [place]   Discharge Planning Services: CM Consult                                           Social Determinants of Health (SDOH) Interventions    Readmission Risk Interventions     No data to display         Reinaldo Raddle, RN, BSN  Trauma/Neuro ICU Case Manager [phone]

## 2022-05-13 NOTE — Progress Notes (Signed)
Occupational Therapy Treatment Patient Details Name: Maria [name] MRN: [id number] DOB: 01-May-1934 Today's Date: 05/13/2022   History of present illness 88 yo female presenting to ED on 9/6 after fall at home. Imaging showing Acute mildly displaced fractures of the left superior and inferior pubic rami with associated extraperitoneal left pelvic hematoma. Per ortho consult, non-operative treatment and WBAT BLEs. PMH including a-fib, HTN, hypothyroid, and abdominal hysterectomy.   OT comments  Pt currently overall min assist level for transfers to and from the toilet as well as LB selfcare sit to stand.  Still with increased pain with standing and weightbearing as well as HR ranging from 110-140 in sitting and with activity.  Feel she is still a high fall risk and will benefit from acute care OT to address these issues.  She declines SNF for follow-up rehab after denial to attend AIR.  Feel she will need 24 hr supervision for home.     Recommendations for follow up therapy are one component of a multi-disciplinary discharge planning process, led by the attending physician.  Recommendations may be updated based on patient status, additional functional criteria and insurance authorization.    Follow Up Recommendations  SNF   Assistance Recommended at Discharge Frequent or constant Supervision/Assistance  Patient can return home with the following  A little help with walking and/or transfers;A little help with bathing/dressing/bathroom;Assistance with cooking/housework;Direct supervision/assist for financial management;Assist for transportation;Help with stairs or ramp for entrance   Equipment Recommendations  None recommended by OT       Precautions / Restrictions Precautions Precautions: Fall Precaution Comments: watch BP Restrictions Weight Bearing Restrictions: No RLE Weight Bearing: Weight bearing as tolerated LLE Weight Bearing: Weight bearing as tolerated       Mobility Bed  Mobility                    Transfers Overall transfer level: Needs assistance Equipment used: Rolling walker (2 wheels) Transfers: Sit to/from Stand, Bed to chair/wheelchair/BSC Sit to Stand: Min assist Stand pivot transfers: Min assist   Step pivot transfers: Min assist     General transfer comment: Pt with flexed posture in standing and short step length on the right secondary to pain with weightbearing on the left.     Balance Overall balance assessment: Needs assistance Sitting-balance support: Feet supported Sitting balance-Leahy Scale: Fair     Standing balance support: During functional activity, Reliant on assistive device for balance Standing balance-Leahy Scale: Poor Standing balance comment: Pt needs UE support on the walker with min therapist assist for balance.                           ADL either performed or assessed with clinical judgement   ADL                       Lower Body Dressing: Minimal assistance;Sit to/from stand   Toilet Transfer: Minimal assistance;Ambulation;Rolling walker (2 wheels);BSC/3in1           Functional mobility during ADLs: Minimal assistance;Rolling walker (2 wheels) (flexed posture) General ADL Comments: Pt with afib and HR ranging from 115-140 in sitting and with mobility to the bathroom and back.               Cognition Arousal/Alertness: Awake/alert Behavior During Therapy: WFL for tasks assessed/performed Overall Cognitive Status: No family/caregiver present to determine baseline cognitive functioning Area of Impairment: Awareness, Problem solving  Awareness: Emergent   General Comments: Pt with decreased anticipatory awareness and declines SNF even though she is a high fall risk and lives alone.              General Comments VSS on RA    Pertinent Vitals/ Pain       Pain Assessment Pain Assessment: Faces Faces Pain Scale: Hurts a little  bit Pain Location: left hip/pelvis region with standing Pain Descriptors / Indicators: Discomfort Pain Intervention(s): Limited activity within patient's tolerance, Monitored during session, Repositioned         Frequency  Min 2X/week        Progress Toward Goals  OT Goals(current goals can now be found in the care plan section)  Progress towards OT goals: Progressing toward goals  Acute Rehab OT Goals Patient Stated Goal: Pt declines SNF and wants to go home. OT Goal Formulation: With patient Time For Goal Achievement: 05/23/22 Potential to Achieve Goals: Good  Plan Discharge plan needs to be updated       AM-PAC OT "6 Clicks" Daily Activity     Outcome Measure   Help from another person eating meals?: None Help from another person taking care of personal grooming?: A Little Help from another person toileting, which includes using toliet, bedpan, or urinal?: A Little Help from another person bathing (including washing, rinsing, drying)?: A Little Help from another person to put on and taking off regular upper body clothing?: A Little Help from another person to put on and taking off regular lower body clothing?: A Little 6 Click Score: 19    End of Session Equipment Utilized During Treatment: Rolling walker (2 wheels)  OT Visit Diagnosis: Other abnormalities of gait and mobility (R26.89);Unsteadiness on feet (R26.81);Muscle weakness (generalized) (M62.81)   Activity Tolerance Patient tolerated treatment well   Patient Left in chair;with call bell/phone within reach;with chair alarm set;with nursing/sitter in room   Nurse Communication Mobility status        Time: 4461-9012 OT Time Calculation (min): 27 min  Charges: OT General Charges $OT Visit: 1 Visit OT Treatments $Self Care/Home Management : 23-37 mins  Maria [name] OTR/L 05/13/2022, 3:23 PM

## 2022-05-13 NOTE — Progress Notes (Signed)
Patient ID: AALIYAN BRINKMEIER, female   DOB: [birth year], 88 y.o.   MRN: [id number]      Subjective: No BM Sore after getting a bath ROS negative except as listed above. Objective: Vital signs in last 24 hours: Temp:  [97.4 F (36.3 C)-97.9 F (36.6 C)] 97.7 F (36.5 C) (09/11 0743) Pulse Rate:  [93-116] 99 (09/11 0834) Resp:  [15-20] 15 (09/11 0834) BP: (93-133)/(59-74) 110/65 (09/11 0743) SpO2:  [93 %-100 %] 97 % (09/11 0834) Last BM Date : 05/05/22  Intake/Output from previous day: 09/10 0701 - 09/11 0700 In: -  Out: 1650 [Urine:1650] Intake/Output this shift: Total I/O In: -  Out: 900 [Urine:900]  General appearance: alert and cooperative Resp: clear to auscultation bilaterally Cardio: S1, S2 normal GI: soft, NT Extremities: L hip tender, calves soft  Lab Results: CBC  Recent Labs    05/12/22 0054 05/13/22 0214  WBC 9.2 9.5  HGB 7.3* 7.6*  HCT 22.8* 23.0*  PLT 152 189   BMET No results for input(s): "NA", "K", "CL", "CO2", "GLUCOSE", "BUN", "CREATININE", "CALCIUM" in the last 72 hours. PT/INR No results for input(s): "LABPROT", "INR" in the last 72 hours. ABG No results for input(s): "PHART", "HCO3" in the last 72 hours.  Invalid input(s): "PCO2", "PO2"  Studies/Results: No results found.  Anti-infectives: Anti-infectives (From admission, onward)    None       Assessment/Plan: Fall Left superior/inferior pubic rami fxs - Ortho [place] see. NWB BLE until they make recommendations Extraperitoneal left pelvic hematoma causing mass effect on bladder - foley in place with pelvic FX, urine clear. Ortho Trauma consult P ABL anemia - hgb stable, de-escalate to daily CBC and d/c MIVF Left elbow skin tear - local wound care A fib on eliquis - eliquis reversed and held on admission HTN - home meds Hypothyroidism - home synthroid Depression - home zoloft ID - none indicated VTE - SCDs only for now, hold eliquis, hold LMWH until hgb stable FEN - d/c  MIVF, diet, on BID Miralax, 1/2 bottle Mg citrate now Foley - none Dispo - 4NP, Lives alone. 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 Will discuss at multidisciplinary Trauma dispo rounds. May need SNF.    LOS: 5 days    Violeta Gelinas, MD, MPH, FACS Trauma & General Surgery Use [email or web address] to contact on call provider  05/13/2022

## 2022-05-13 NOTE — Progress Notes (Signed)
Physical Therapy Treatment Patient Details Name: Maria [name] MRN: [id number] DOB: 1934-08-06 Today's Date: 05/13/2022   History of Present Illness 88 yo female presenting to ED on 9/6 after fall at home. Imaging showing Acute mildly displaced fractures of the left superior and inferior pubic rami with associated extraperitoneal left pelvic hematoma. Per ortho consult, non-operative treatment and WBAT BLEs. PMH including a-fib, HTN, hypothyroid, and abdominal hysterectomy.    PT Comments    The pt was agreeable to session with focus on progressive ambulation and mobility. She continues to require min-modA to complete bed mobility and sit-stand transfers due to pain levels, limited strength, power, and poor functional wt shift on BLE. The pt was able to make progress with ambulation distance, but is limited to ~8 ft at a time with minA and assist to manage RW. Given assist needed for all mobility, continue to recommend acute inpatient rehab if possible, and SNF rehab if insurance denies as the pt does not have any friends/neighbors who can provide physical assistance after d/c.     Recommendations for follow up therapy are one component of a multi-disciplinary discharge planning process, led by the attending physician.  Recommendations may be updated based on patient status, additional functional criteria and insurance authorization.  Follow Up Recommendations  Acute inpatient rehab (3hours/day)     Assistance Recommended at Discharge Frequent or constant Supervision/Assistance (who can provide physical assistance)  Patient can return home with the following A lot of help with walking and/or transfers;A lot of help with bathing/dressing/bathroom;Assistance with feeding;Assistance with cooking/housework;Direct supervision/assist for medications management;Direct supervision/assist for financial management;Help with stairs or ramp for entrance   Equipment Recommendations  None recommended by PT     Recommendations for Other Services       Precautions / Restrictions Precautions Precautions: Fall Precaution Comments: watch BP Restrictions Weight Bearing Restrictions: Yes RLE Weight Bearing: Weight bearing as tolerated LLE Weight Bearing: Weight bearing as tolerated     Mobility  Bed Mobility Overal bed mobility: Needs Assistance Bed Mobility: Supine to Sit     Supine to sit: Min assist, HOB elevated     General bed mobility comments: minA to pull to sitting    Transfers Overall transfer level: Needs assistance Equipment used: Rolling walker (2 wheels) Transfers: Sit to/from Stand, Bed to chair/wheelchair/BSC Sit to Stand: Min assist, From elevated surface   Step pivot transfers: Min assist       General transfer comment: minA to rise and to take small steps to R. pt unable to power up to standing without minA, poor anterior wt shift and needinc cues for hand placement    Ambulation/Gait Ambulation/Gait assistance: Mod assist, +2 safety/equipment Gait Distance (Feet): 5 Feet (x2) Assistive device: Rolling walker (2 wheels) Gait Pattern/deviations: Step-to pattern, Decreased stance time - left, Decreased step length - left, Decreased dorsiflexion - left, Decreased weight shift to left, Shuffle Gait velocity: decreased Gait velocity interpretation: <1.31 ft/sec, indicative of household ambulator   General Gait Details: small steps with limited clearance bilaterally. no overt LOB but minA to maintain upright and assist wtih RW movement.      Balance Overall balance assessment: Needs assistance Sitting-balance support: No upper extremity supported, Feet supported Sitting balance-Leahy Scale: Fair     Standing balance support: Bilateral upper extremity supported, During functional activity Standing balance-Leahy Scale: Poor Standing balance comment: mod-maxA of 2  Cognition Arousal/Alertness: Awake/alert Behavior  During Therapy: WFL for tasks assessed/performed Overall Cognitive Status: Impaired/Different from baseline Area of Impairment: Awareness, Problem solving                           Awareness: Emergent Problem Solving: Decreased initiation, Difficulty sequencing, Requires verbal cues General Comments: pt needs continued instruction to remain on top of pain medicine and ask for alternate pain management (ice) able to follow all commands with increased tiem        Exercises      General Comments General comments (skin integrity, edema, etc.): VSS on RA      Pertinent Vitals/Pain Pain Assessment Pain Assessment: Faces Faces Pain Scale: Hurts whole lot Pain Location: LLE, hip with weightbearing Pain Descriptors / Indicators: Discomfort, Grimacing Pain Intervention(s): Limited activity within patient's tolerance, Monitored during session, Premedicated before session, Repositioned, Ice applied     PT Goals (current goals can now be found in the care plan section) Acute Rehab PT Goals Patient Stated Goal: return home and to independence PT Goal Formulation: With patient Time For Goal Achievement: 05/23/22 Potential to Achieve Goals: Good Progress towards PT goals: Progressing toward goals    Frequency    Min 4X/week      PT Plan Current plan remains appropriate       AM-PAC PT "6 Clicks" Mobility   Outcome Measure  Help needed turning from your back to your side while in a flat bed without using bedrails?: A Little Help needed moving from lying on your back to sitting on the side of a flat bed without using bedrails?: A Little Help needed moving to and from a bed to a chair (including a wheelchair)?: A Lot Help needed standing up from a chair using your arms (e.g., wheelchair or bedside chair)?: A Lot Help needed to walk in hospital room?: Total Help needed climbing 3-5 steps with a railing? : Total 6 Click Score: 12    End of Session Equipment Utilized  During Treatment: Gait belt Activity Tolerance: Patient limited by pain Patient left: in chair;with chair alarm set;with nursing/sitter in room Nurse Communication: Mobility status PT Visit Diagnosis: Unsteadiness on feet (R26.81);Other abnormalities of gait and mobility (R26.89);Muscle weakness (generalized) (M62.81);History of falling (Z91.81)     Time: 7408-1448 PT Time Calculation (min) (ACUTE ONLY): 33 min  Charges:  $Gait Training: 8-22 mins $Therapeutic Activity: 8-22 mins                     Vickki Muff, PT, DPT   Acute Rehabilitation Department   Ronnie Derby 05/13/2022, 2:00 PM

## 2022-05-13 NOTE — Care Management Important Message (Signed)
Important Message  Patient Details  Name: Maria [name] MRN: [id number] Date of Birth: 1934/03/07   Medicare Important Message Given:  Yes     Sherilyn Banker 05/13/2022, 11:46 AM

## 2022-05-14 LAB — CBC
HCT: 26.8 % — ABNORMAL LOW (ref 36.0–46.0)
Hemoglobin: 8.5 g/dL — ABNORMAL LOW (ref 12.0–15.0)
MCH: 29.4 pg (ref 26.0–34.0)
MCHC: 31.7 g/dL (ref 30.0–36.0)
MCV: 92.7 fL (ref 80.0–100.0)
Platelets: 241 10*3/uL (ref 150–400)
RBC: 2.89 MIL/uL — ABNORMAL LOW (ref 3.87–5.11)
RDW: 17 % — ABNORMAL HIGH (ref 11.5–15.5)
WBC: 11.8 10*3/uL — ABNORMAL HIGH (ref 4.0–10.5)
nRBC: 0.3 % — ABNORMAL HIGH (ref 0.0–0.2)

## 2022-05-14 MED ORDER — VITAMIN C 500 MG PO TABS
500.0000 mg | ORAL_TABLET | Freq: Three times a day (TID) | ORAL | Status: DC
  Administered 2022-05-14 – 2022-05-16 (×6): 500 mg via ORAL
  Filled 2022-05-14 (×6): qty 1

## 2022-05-14 MED ORDER — POLYETHYLENE GLYCOL 3350 17 G PO PACK
17.0000 g | PACK | Freq: Every day | ORAL | Status: DC
  Administered 2022-05-15 – 2022-05-16 (×2): 17 g via ORAL
  Filled 2022-05-14 (×2): qty 1

## 2022-05-14 MED ORDER — ENOXAPARIN SODIUM 30 MG/0.3ML IJ SOSY
30.0000 mg | PREFILLED_SYRINGE | Freq: Two times a day (BID) | INTRAMUSCULAR | Status: DC
  Administered 2022-05-14 – 2022-05-16 (×5): 30 mg via SUBCUTANEOUS
  Filled 2022-05-14 (×5): qty 0.3

## 2022-05-14 MED ORDER — BOOST / RESOURCE BREEZE PO LIQD CUSTOM
1.0000 | Freq: Three times a day (TID) | ORAL | Status: DC
  Administered 2022-05-14 – 2022-05-16 (×6): 1 via ORAL

## 2022-05-14 NOTE — Progress Notes (Signed)
   Progress Note   Date: 05/10/2022  Patient Name: Maria [name]        MRN#: [id number]  Eliquis contributed to Hematoma

## 2022-05-14 NOTE — Progress Notes (Signed)
Physical Therapy Treatment Patient Details Name: Maria [name] MRN: [id number] DOB: [birth year] Today's Date: 05/14/2022   History of Present Illness 88 yo female presenting to ED on 9/6 after fall at home. Imaging showing Acute mildly displaced fractures of the left superior and inferior pubic rami with associated extraperitoneal left pelvic hematoma. Per ortho consult, non-operative treatment and WBAT BLEs. PMH including a-fib, HTN, hypothyroid, and abdominal hysterectomy.    PT Comments    Pt limited by pain but eager to be OOB and work with therapy.  She performed multiple transfers and ambulated short distances.  Requiring min to mod A and cues.  Pt declined longer distance ambulation due to pain - reports wants to try ice packs and may could walk later.  Will f/u as able.     Recommendations for follow up therapy are one component of a multi-disciplinary discharge planning process, led by the attending physician.  Recommendations may be updated based on patient status, additional functional criteria and insurance authorization.  Follow Up Recommendations  Skilled nursing-short term rehab (<3 hours/day) Can patient physically be transported by private vehicle: Yes   Assistance Recommended at Discharge Frequent or constant Supervision/Assistance  Patient can return home with the following A lot of help with walking and/or transfers;A lot of help with bathing/dressing/bathroom;Assistance with feeding;Assistance with cooking/housework;Direct supervision/assist for medications management;Direct supervision/assist for financial management;Help with stairs or ramp for entrance   Equipment Recommendations  None recommended by PT    Recommendations for Other Services       Precautions / Restrictions Precautions Precautions: Fall Restrictions RLE Weight Bearing: Weight bearing as tolerated LLE Weight Bearing: Weight bearing as tolerated     Mobility  Bed Mobility Overal bed mobility:  Needs Assistance Bed Mobility: Supine to Sit     Supine to sit: Mod assist, HOB elevated     General bed mobility comments: Increased time, cues for moving legs to EOB (keeping close for pain control) then mod A to lift trunk and scoot forward    Transfers Overall transfer level: Needs assistance Equipment used: Rolling walker (2 wheels) Transfers: Sit to/from Stand Sit to Stand: Min assist           General transfer comment: Cues for had placement.  Stood from bed, chair, and BSC with min A to rise and steady    Ambulation/Gait Ambulation/Gait assistance: Editor, commissioning (Feet): 5 Feet (5'x2) Assistive device: Rolling walker (2 wheels) Gait Pattern/deviations: Step-to pattern, Decreased stance time - left, Decreased step length - left, Decreased dorsiflexion - left, Decreased weight shift to left, Shuffle Gait velocity: decreased     General Gait Details: small steps with limited clearance bilaterally. no overt LOB but minA to maintain upright and assist wtih RW movement.   Stairs             Wheelchair Mobility    Modified Rankin (Stroke Patients Only)       Balance Overall balance assessment: Needs assistance Sitting-balance support: Feet supported Sitting balance-Leahy Scale: Fair     Standing balance support: During functional activity, Reliant on assistive device for balance Standing balance-Leahy Scale: Poor Standing balance comment: Pt needs UE support on the walker with min therapist assist for balance.                            Cognition Arousal/Alertness: Awake/alert Behavior During Therapy: WFL for tasks assessed/performed Overall Cognitive Status: Within Functional Limits for tasks assessed  Exercises General Exercises - Lower Extremity Ankle Circles/Pumps: AROM, Both, 5 reps, Seated Long Arc Quad: AROM, Both, 5 reps, Seated    General Comments General  comments (skin integrity, edema, etc.): VSS on RA; required assist for ADLs      Pertinent Vitals/Pain Pain Assessment Pain Assessment: Faces Faces Pain Scale: Hurts even more Pain Location: left hip/pelvis region with standing Pain Descriptors / Indicators: Discomfort Pain Intervention(s): Limited activity within patient's tolerance, Monitored during session, Ice applied, Premedicated before session, Repositioned    Home Living                          Prior Function            PT Goals (current goals can now be found in the care plan section) Progress towards PT goals: Progressing toward goals    Frequency    Min 4X/week      PT Plan Discharge plan needs to be updated (AIR denied)    Co-evaluation              AM-PAC PT "6 Clicks" Mobility   Outcome Measure  Help needed turning from your back to your side while in a flat bed without using bedrails?: A Little Help needed moving from lying on your back to sitting on the side of a flat bed without using bedrails?: A Lot Help needed moving to and from a bed to a chair (including a wheelchair)?: A Lot Help needed standing up from a chair using your arms (e.g., wheelchair or bedside chair)?: A Lot Help needed to walk in hospital room?: Total Help needed climbing 3-5 steps with a railing? : Total 6 Click Score: 11    End of Session Equipment Utilized During Treatment: Gait belt Activity Tolerance: Patient limited by pain Patient left: in chair;with chair alarm set;with nursing/sitter in room Nurse Communication: Mobility status PT Visit Diagnosis: Unsteadiness on feet (R26.81);Other abnormalities of gait and mobility (R26.89);Muscle weakness (generalized) (M62.81);History of falling (Z91.81)     Time: 8850-2774 PT Time Calculation (min) (ACUTE ONLY): 26 min  Charges:  $Therapeutic Activity: 23-37 mins                     Anise Salvo, PT Acute Rehab [facility] Rehab [phone]    Rayetta Humphrey 05/14/2022, 2:28 PM

## 2022-05-14 NOTE — Progress Notes (Signed)
[facility] Progress Note     Subjective: CC-  Continues to have pain from pelvic fractures, mostly in the morning and when she mobilizes.  Tolerating diet. Finally had a BM last night. She lists 4 different friends that she is working with to help her look into her insurance, and who may be able to provide 24 hour supervision.   Objective: Vital signs in last 24 hours: Temp:  [97.5 F (36.4 C)-98.1 F (36.7 C)] 98 F (36.7 C) (09/12 0750) Pulse Rate:  [95-106] 106 (09/12 0750) Resp:  [16-19] 18 (09/12 0750) BP: (106-120)/(66-69) 107/66 (09/12 0750) SpO2:  [90 %-100 %] 93 % (09/12 0750) Last BM Date : 05/14/22  Intake/Output from previous day: 09/11 0701 - 09/12 0700 In: -  Out: 2200 [Urine:2200] Intake/Output this shift: Total I/O In: 240 [P.O.:240] Out: -   PE: Gen:  Alert, NAD, pleasant Card: irregular Pulm:  CTAB, no W/R/R, rate and effort normal on room air Abd: Soft, NT/ND Ext:  no BUE/BLE edema, calves soft and nontender, left hip tender with hematoma Psych: A&Ox4  Skin: no rashes noted, warm and dry  Lab Results:  Recent Labs    05/13/22 0214 05/14/22 0156  WBC 9.5 11.8*  HGB 7.6* 8.5*  HCT 23.0* 26.8*  PLT 189 241   BMET No results for input(s): "NA", "K", "CL", "CO2", "GLUCOSE", "BUN", "CREATININE", "CALCIUM" in the last 72 hours. PT/INR No results for input(s): "LABPROT", "INR" in the last 72 hours. CMP     Component Value Date/Time   NA 137 05/10/2022 0738   K 3.6 05/10/2022 0738   CL 103 05/10/2022 0738   CO2 25 05/10/2022 0738   GLUCOSE 111 (H) 05/10/2022 0738   BUN 11 05/10/2022 0738   CREATININE 0.72 05/10/2022 0738   CALCIUM 8.3 (L) 05/10/2022 0738   PROT 7.6 05/08/2022 1325   ALBUMIN 4.1 05/08/2022 1325   AST 52 (H) 05/08/2022 1325   ALT 25 05/08/2022 1325   ALKPHOS 60 05/08/2022 1325   BILITOT 1.1 05/08/2022 1325   GFRNONAA >60 05/10/2022 0738   Lipase     Component Value Date/Time   LIPASE 39 10/18/2021 1312        Studies/Results: No results found.  Anti-infectives: Anti-infectives (From admission, onward)    None        Assessment/Plan Fall Left superior/inferior pubic rami fxs - nonop, WBAT BLE, follow up Dr. Eulah Pont in 3 weeks Extraperitoneal left pelvic hematoma causing mass effect on bladder - u/a negative for blood. Voiding without issues ABL anemia - hgb stable at 8.5, continue iron and vitamin C Left elbow skin tear - local wound care A fib on eliquis - eliquis reversed and held on admission HTN - home meds Hypothyroidism - home synthroid Depression - home zoloft ID - none indicated VTE - SCDs, start lovenox 9/12 and repeat CBC in AM FEN - reg diet Foley - none Dispo - 4NP, Lives alone. 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 She does not want SNF and is working with 4 friends to see if they would be able to help upon discharge, she does not want me to call any of them. If unable to obtain 24 hour supervision she may need SNF.   LOS: 6 days    Franne Forts, [facility] 05/14/2022, 11:37 AM Please see Amion for pager number during day hours 7:00am-4:30pm

## 2022-05-14 NOTE — [place] FL2 (Cosign Needed Addendum)
[place] MEDICAID FL2 LEVEL OF CARE SCREENING TOOL     IDENTIFICATION  Patient Name: Maria [name] Birthdate: 04/27/1934 Sex: female Admission Date (Current Location): 05/08/2022  [facility] and [place] Number:  Producer, television/film/video and Address:  [facility], [address], [place], [place] [address]      Provider Number: [id number]  Attending Physician Name and Address:  Dr. Violeta Gelinas, MD  Relative Name and Phone Number:  Darla Lesches, friend; [phone]    Current Level of Care: Hospital Recommended Level of Care: Skilled Nursing Facility Prior Approval Number:    Date Approved/Denied:   PASRR Number: [id number]  Discharge Plan: SNF    Current Diagnoses: Patient Active Problem List   Diagnosis Date Noted   Multiple pelvic fractures (HCC) 05/08/2022   Fall 05/08/2022   Pressure injury of skin 10/20/2021   Primary hypertension    Hypothyroidism    Generalized anxiety disorder    Traumatic rhabdomyolysis (HCC) 10/18/2021   Paroxysmal atrial fibrillation with RVR (HCC) 10/18/2021   Dehydration 10/18/2021   Elevated LFTs 10/18/2021   Falls 10/18/2021   Elevated troponin 10/18/2021   SIRS (systemic inflammatory response syndrome) (HCC) 10/18/2021    Orientation RESPIRATION BLADDER Height & Weight     Self, Time, Situation, Place  Normal Continent Weight: 67 kg Height:  5\' 4"  (162.6 cm)  BEHAVIORAL SYMPTOMS/MOOD NEUROLOGICAL BOWEL NUTRITION STATUS      Continent Diet (Heart healthy thin liquid)  AMBULATORY STATUS COMMUNICATION OF NEEDS Skin   Limited Assist Verbally Bruising (Left arm and hip)                       Personal Care Assistance Level of Assistance  Bathing, Feeding, Dressing Bathing Assistance: Limited assistance Feeding assistance: Independent Dressing Assistance: Limited assistance     Functional Limitations Info  Sight Sight Info: Adequate (wears glasses)        SPECIAL CARE FACTORS FREQUENCY  PT  (By licensed PT), OT (By licensed OT)     PT Frequency: 5x weekly OT Frequency: 5x weekly            Contractures Contractures Info: Not present    Additional Factors Info  Code Status, Allergies Code Status Info: Full Allergies Info: Clindamycin-SOB, rash; Diclofenac Sodium-rash,nasal congestion; NSAIDS-rash, nasal congestion           Current Medications (05/14/2022):  This is the current hospital active medication list Current Facility-Administered Medications  Medication Dose Route Frequency Provider Last Rate Last Admin   acetaminophen (TYLENOL) tablet 1,000 mg  1,000 mg Oral Q6H Lovick, 07/14/2022, MD   1,000 mg at 05/14/22 1213   ascorbic acid (VITAMIN C) tablet 500 mg  500 mg Oral TID Meuth, Brooke A, PA-C       bacitracin ointment   Topical BID Meuth, Brooke A, PA-C   Given at 05/14/22 07/14/22   calcium carbonate (TUMS - dosed in mg elemental calcium) chewable tablet 400 mg of elemental calcium  2 tablet Oral Q4H PRN 9030, MD   400 mg of elemental calcium at 05/11/22 2202   Chlorhexidine Gluconate Cloth 2 % PADS 6 each  6 each Topical Daily 2203, MD   6 each at 05/14/22 0836   diltiazem (CARDIZEM CD) 24 hr capsule 300 mg  300 mg Oral QPM Meuth, Brooke A, PA-C   300 mg at 05/13/22 1729   diphenhydrAMINE (BENADRYL) capsule 25 mg  25 mg Oral Q6H  PRN Axel Filler, MD   25 mg at 05/14/22 0532   docusate sodium (COLACE) capsule 100 mg  100 mg Oral BID Carlena Bjornstad A, PA-C   100 mg at 05/13/22 2159   enoxaparin (LOVENOX) injection 30 mg  30 mg Subcutaneous Q12H Meuth, Brooke A, PA-C   30 mg at 05/14/22 1213   feeding supplement (BOOST / RESOURCE BREEZE) liquid 1 Container  1 Container Oral TID BM Meuth, Brooke A, PA-C       ferrous sulfate tablet 325 mg  325 mg Oral TID WC Diamantina Monks, MD   325 mg at 05/12/22 0835   hydrochlorothiazide (HYDRODIURIL) tablet 25 mg  25 mg Oral q AM Meuth, Brooke A, PA-C   25 mg at 05/14/22 0532   levothyroxine  (SYNTHROID) tablet 88 mcg  88 mcg Oral Q0600 Carlena Bjornstad A, PA-C   88 mcg at 05/14/22 0532   methocarbamol (ROBAXIN) tablet 750 mg  750 mg Oral QID Diamantina Monks, MD   750 mg at 05/14/22 1321   metoprolol tartrate (LOPRESSOR) injection 5 mg  5 mg Intravenous Q6H PRN Meuth, Brooke A, PA-C       ondansetron (ZOFRAN-ODT) disintegrating tablet 4 mg  4 mg Oral Q6H PRN Meuth, Brooke A, PA-C       Or   ondansetron (ZOFRAN) injection 4 mg  4 mg Intravenous Q6H PRN Meuth, Brooke A, PA-C   4 mg at 05/09/22 1207   Oral care mouth rinse  15 mL Mouth Rinse PRN Sheral Apley, MD       oxyCODONE (Oxy IR/ROXICODONE) immediate release tablet 2.5-5 mg  2.5-5 mg Oral Q4H PRN Meuth, Brooke A, PA-C   5 mg at 05/14/22 0624   [START ON 05/15/2022] polyethylene glycol (MIRALAX / GLYCOLAX) packet 17 g  17 g Oral Daily Meuth, Brooke A, PA-C       senna (SENOKOT) tablet 8.6 mg  1 tablet Oral Daily Violeta Gelinas, MD   8.6 mg at 05/13/22 1322   sertraline (ZOLOFT) tablet 50 mg  50 mg Oral Daily Meuth, Brooke A, PA-C   50 mg at 05/14/22 8916     Discharge Medications: Please see discharge summary for a list of discharge medications.  Relevant Imaging Results:  Relevant Lab Results:   Additional Information SS# [id number]  Quintella Baton, RN, BSN  Trauma/Neuro ICU Case Manager [phone]

## 2022-05-14 NOTE — Progress Notes (Signed)
  Progress Note   Date: 05/10/2022  Patient Name: Maria [name]        MRN#: [id number]  Potassium was given for hypokalemia

## 2022-05-14 NOTE — TOC Progression Note (Signed)
Transition of Care [facility]) - Progression Note    Patient Details  Name: Maria [name] MRN: [id number] Date of Birth: Mar 05, 1934  Transition of Care [facility]) CM/SW Contact  Ella Bodo, RN Phone Number: 05/14/2022, 2:30pm  Clinical Narrative:    Met with patient to discuss discharge plans; she has not been able to find anyone to stay with her at discharge.  We again discussed possible SNF for short term rehab, and she is now agreeable. Will initiate FL2 and fax out for bed search; will provide bed offers when available.    Expected Discharge Plan: Skilled Nursing Facility Barriers to Discharge: Continued Medical Work up  Expected Discharge Plan and Services Expected Discharge Plan: [place]   Discharge Planning Services: CM Consult                                           Social Determinants of Health (SDOH) Interventions    Readmission Risk Interventions     No data to display         Reinaldo Raddle, RN, BSN  Trauma/Neuro ICU Case Manager [phone]

## 2022-05-15 LAB — CBC
HCT: 26.3 % — ABNORMAL LOW (ref 36.0–46.0)
Hemoglobin: 8.5 g/dL — ABNORMAL LOW (ref 12.0–15.0)
MCH: 29.9 pg (ref 26.0–34.0)
MCHC: 32.3 g/dL (ref 30.0–36.0)
MCV: 92.6 fL (ref 80.0–100.0)
Platelets: 254 10*3/uL (ref 150–400)
RBC: 2.84 MIL/uL — ABNORMAL LOW (ref 3.87–5.11)
RDW: 17.8 % — ABNORMAL HIGH (ref 11.5–15.5)
WBC: 8.9 10*3/uL (ref 4.0–10.5)
nRBC: 0.4 % — ABNORMAL HIGH (ref 0.0–0.2)

## 2022-05-15 LAB — BASIC METABOLIC PANEL
Anion gap: 10 (ref 5–15)
BUN: 10 mg/dL (ref 8–23)
CO2: 27 mmol/L (ref 22–32)
Calcium: 8.7 mg/dL — ABNORMAL LOW (ref 8.9–10.3)
Chloride: 98 mmol/L (ref 98–111)
Creatinine, Ser: 0.68 mg/dL (ref 0.44–1.00)
GFR, Estimated: >60 mL/min (ref >60)
Glucose, Bld: 92 mg/dL (ref 70–99)
Potassium: 3.3 mmol/L — ABNORMAL LOW (ref 3.5–5.1)
Sodium: 135 mmol/L (ref 135–145)

## 2022-05-15 LAB — MAGNESIUM: Magnesium: 2.2 mg/dL (ref 1.7–2.4)

## 2022-05-15 MED ORDER — POLYETHYLENE GLYCOL 3350 17 G PO PACK
17.0000 g | PACK | Freq: Every day | ORAL | 0 refills | Status: AC
Start: 2022-05-16 — End: ?

## 2022-05-15 MED ORDER — POTASSIUM CHLORIDE 20 MEQ PO PACK
40.0000 meq | PACK | Freq: Two times a day (BID) | ORAL | Status: AC
Start: 2022-05-15 — End: 2022-05-15
  Administered 2022-05-15 (×2): 40 meq via ORAL
  Filled 2022-05-15 (×2): qty 2

## 2022-05-15 MED ORDER — DOCUSATE SODIUM 100 MG PO CAPS: 100.0000 mg | ORAL_CAPSULE | Freq: Two times a day (BID) | ORAL | 0 refills | Status: AC

## 2022-05-15 MED ORDER — OXYCODONE HCL 5 MG PO TABS: 2.5000 mg | ORAL_TABLET | Freq: Four times a day (QID) | ORAL | 0 refills | Status: AC | PRN

## 2022-05-15 MED ORDER — METHOCARBAMOL 750 MG PO TABS: 750.0000 mg | ORAL_TABLET | Freq: Four times a day (QID) | ORAL | Status: AC | PRN

## 2022-05-15 MED ORDER — SENNA 8.6 MG PO TABS: 1.0000 | ORAL_TABLET | Freq: Every day | ORAL | 0 refills | Status: AC

## 2022-05-15 MED ORDER — BACITRACIN ZINC 500 UNIT/GM EX OINT: TOPICAL_OINTMENT | Freq: Two times a day (BID) | CUTANEOUS | 0 refills | Status: AC

## 2022-05-15 MED ORDER — FERROUS SULFATE 325 (65 FE) MG PO TABS: 325.0000 mg | ORAL_TABLET | Freq: Three times a day (TID) | ORAL | 3 refills | Status: AC

## 2022-05-15 NOTE — Progress Notes (Signed)
[facility] Progress Note     Subjective: CC-  Up in chair. Continues to have some pain when she mobilizes but it is improving.  Tolerating diet. BM yesterday. Would like to go to [facility] if possible.  Objective: Vital signs in last 24 hours: Temp:  [97.4 F (36.3 C)-98.3 F (36.8 C)] 97.8 F (36.6 C) (09/13 0731) Pulse Rate:  [90-101] 96 (09/13 0731) Resp:  [16-20] 19 (09/13 0731) BP: (95-117)/(52-85) 110/61 (09/13 0731) SpO2:  [91 %-99 %] 96 % (09/13 0731) Last BM Date : 05/14/22  Intake/Output from previous day: 09/12 0701 - 09/13 0700 In: 720 [P.O.:720] Out: 1450 [Urine:1450] Intake/Output this shift: No intake/output data recorded.  PE: Gen:  Alert, NAD, pleasant Card: irregular Pulm:  CTAB, no W/R/R, rate and effort normal on room air Abd: Soft, NT/ND Ext:  no BUE/BLE edema, calves soft and nontender, left hip tender with hematoma Psych: A&Ox4  Skin: no rashes noted, warm and dry  Lab Results:  Recent Labs    05/14/22 0156 05/15/22 0202  WBC 11.8* 8.9  HGB 8.5* 8.5*  HCT 26.8* 26.3*  PLT 241 254   BMET Recent Labs    05/15/22 0202  NA 135  K 3.3*  CL 98  CO2 27  GLUCOSE 92  BUN 10  CREATININE 0.68  CALCIUM 8.7*   PT/INR No results for input(s): "LABPROT", "INR" in the last 72 hours. CMP     Component Value Date/Time   NA 135 05/15/2022 0202   K 3.3 (L) 05/15/2022 0202   CL 98 05/15/2022 0202   CO2 27 05/15/2022 0202   GLUCOSE 92 05/15/2022 0202   BUN 10 05/15/2022 0202   CREATININE 0.68 05/15/2022 0202   CALCIUM 8.7 (L) 05/15/2022 0202   PROT 7.6 05/08/2022 1325   ALBUMIN 4.1 05/08/2022 1325   AST 52 (H) 05/08/2022 1325   ALT 25 05/08/2022 1325   ALKPHOS 60 05/08/2022 1325   BILITOT 1.1 05/08/2022 1325   GFRNONAA >60 05/15/2022 0202   Lipase     Component Value Date/Time   LIPASE 39 10/18/2021 1312       Studies/Results: No results found.  Anti-infectives: Anti-infectives (From admission, onward)     None        Assessment/Plan Fall Left superior/inferior pubic rami fxs - nonop, WBAT BLE, follow up Dr. Eulah Pont in 3 weeks Extraperitoneal left pelvic hematoma causing mass effect on bladder - u/a negative for blood. Voiding without issues ABL anemia - hgb stable at 8.5, continue iron and vitamin C Left elbow skin tear - local wound care A fib on eliquis - eliquis reversed and held on admission HTN - home meds Hypothyroidism - home synthroid Depression - home zoloft ID - none indicated VTE - SCDs, lovenox  FEN - reg diet. Replete K and check Mag level Foley - none Dispo - 4NP, Lives alone. 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 Ok with possibility of SNF now, hoping [facility] will be an option. Continue therapies.   LOS: 7 days    Franne Forts, [facility] Surgery 05/15/2022, 10:23 AM Please see Amion for pager number during day hours 7:00am-4:30pm

## 2022-05-15 NOTE — Progress Notes (Signed)
Physical Therapy Treatment Patient Details Name: Maria [name] MRN: [id number] DOB: 1933-12-06 Today's Date: 05/15/2022   History of Present Illness 88 yo female presenting to ED on 9/6 after fall at home. Imaging showing Acute mildly displaced fractures of the left superior and inferior pubic rami with associated extraperitoneal left pelvic hematoma. Per ortho consult, non-operative treatment and WBAT BLEs. PMH including a-fib, HTN, hypothyroid, and abdominal hysterectomy.    PT Comments    Pt is motivated to participate and improve despite her pain that limits her. She is requiring min-modA for transfers and minA for stability when ambulating with a RW. She has deficits in balance and lower extremity strength, which are impacted by her pain. She is at high risk for falls and would benefit from further therapy at a SNF. Will continue to follow acutely.     Recommendations for follow up therapy are one component of a multi-disciplinary discharge planning process, led by the attending physician.  Recommendations may be updated based on patient status, additional functional criteria and insurance authorization.  Follow Up Recommendations  Skilled nursing-short term rehab (<3 hours/day) Can patient physically be transported by private vehicle: Yes   Assistance Recommended at Discharge Frequent or constant Supervision/Assistance  Patient can return home with the following A lot of help with walking and/or transfers;A lot of help with bathing/dressing/bathroom;Assistance with feeding;Assistance with cooking/housework;Direct supervision/assist for medications management;Direct supervision/assist for financial management;Help with stairs or ramp for entrance   Equipment Recommendations  None recommended by PT    Recommendations for Other Services       Precautions / Restrictions Precautions Precautions: Fall Restrictions Weight Bearing Restrictions: Yes RLE Weight Bearing: Weight bearing  as tolerated LLE Weight Bearing: Weight bearing as tolerated     Mobility  Bed Mobility Overal bed mobility: Needs Assistance Bed Mobility: Supine to Sit     Supine to sit: Min assist, HOB elevated     General bed mobility comments: HOB elevated maximally per pt request, needing extra time to pivot hips and HHA minA to scoot and sit up L EOB.    Transfers Overall transfer level: Needs assistance Equipment used: Rolling walker (2 wheels) Transfers: Sit to/from Stand, Bed to chair/wheelchair/BSC Sit to Stand: Min assist, Mod assist   Step pivot transfers: Min assist       General transfer comment: Pt coming to stand first time from elevated EOB, almost coming to full stand when pt had LOB and required modA to safely return to sit on EOB. MinA for stability with subsequent 1x from EOB and 3x from recliner. MinA for stability with stand step to L bed > recliner with RW, cuing pt for improved upright posture.    Ambulation/Gait Ambulation/Gait assistance: Min assist Gait Distance (Feet): 4 Feet Assistive device: Rolling walker (2 wheels) Gait Pattern/deviations: Step-to pattern, Decreased stance time - left, Decreased step length - left, Decreased dorsiflexion - left, Decreased weight shift to left, Shuffle, Trunk flexed Gait velocity: reduced Gait velocity interpretation: <1.31 ft/sec, indicative of household ambulator   General Gait Details: Pt with small, unsteady steps with flexed posture and heavy reliance on RW. MinA for stability.   Stairs             Wheelchair Mobility    Modified Rankin (Stroke Patients Only)       Balance Overall balance assessment: Needs assistance Sitting-balance support: Feet supported Sitting balance-Leahy Scale: Fair     Standing balance support: During functional activity, Reliant on assistive device for balance Standing  balance-Leahy Scale: Poor Standing balance comment: Pt needs use of the RW for standing or mobility.                             Cognition Arousal/Alertness: Awake/alert Behavior During Therapy: WFL for tasks assessed/performed Overall Cognitive Status: Within Functional Limits for tasks assessed                                          Exercises Other Exercises Other Exercises: sit <> stand 5x    General Comments        Pertinent Vitals/Pain Pain Assessment Pain Assessment: Faces Faces Pain Scale: Hurts even more Pain Location: left hip/pelvis region with standing Pain Descriptors / Indicators: Discomfort, Grimacing, Guarding Pain Intervention(s): Limited activity within patient's tolerance, Monitored during session, Repositioned    Home Living                          Prior Function            PT Goals (current goals can now be found in the care plan section) Acute Rehab PT Goals Patient Stated Goal: to improve PT Goal Formulation: With patient Time For Goal Achievement: 05/23/22 Potential to Achieve Goals: Good Progress towards PT goals: Progressing toward goals    Frequency    Min 4X/week      PT Plan Current plan remains appropriate    Co-evaluation              AM-PAC PT "6 Clicks" Mobility   Outcome Measure  Help needed turning from your back to your side while in a flat bed without using bedrails?: A Little Help needed moving from lying on your back to sitting on the side of a flat bed without using bedrails?: A Little Help needed moving to and from a bed to a chair (including a wheelchair)?: A Little Help needed standing up from a chair using your arms (e.g., wheelchair or bedside chair)?: A Lot Help needed to walk in hospital room?: Total Help needed climbing 3-5 steps with a railing? : Total 6 Click Score: 13    End of Session Equipment Utilized During Treatment: Gait belt Activity Tolerance: Patient limited by pain Patient left: in chair;with call bell/phone within reach;with chair alarm set   PT  Visit Diagnosis: Unsteadiness on feet (R26.81);Other abnormalities of gait and mobility (R26.89);Muscle weakness (generalized) (M62.81);History of falling (Z91.81);Difficulty in walking, not elsewhere classified (R26.2)     Time: 1700-1715 PT Time Calculation (min) (ACUTE ONLY): 15 min  Charges:  $Therapeutic Activity: 8-22 mins                     Raymond Gurney, PT, DPT Acute Rehabilitation Services  Office: [phone]    Jewel Baize 05/15/2022, 5:20 PM

## 2022-05-15 NOTE — Progress Notes (Signed)
Occupational Therapy Treatment Patient Details Name: Maria [name] MRN: [id number] DOB: [birth year] Today's Date: 05/15/2022   History of present illness 88 yo female presenting to ED on 9/6 after fall at home. Imaging showing Acute mildly displaced fractures of the left superior and inferior pubic rami with associated extraperitoneal left pelvic hematoma. Per ortho consult, non-operative treatment and WBAT BLEs. PMH including a-fib, HTN, hypothyroid, and abdominal hysterectomy.   OT comments  Pt currently still min to mod assist for transfers with use of the RW and for LB selfcare sit to stand.  Still with increased pain in standing and with mobility in the left hip/pelvis region.  Feel she will continue to benefit from acute care OT to address ADL dysfunction.  Continue to recommend SNF for follow-up to progress to modified independent level for home alone.    Recommendations for follow up therapy are one component of a multi-disciplinary discharge planning process, led by the attending physician.  Recommendations may be updated based on patient status, additional functional criteria and insurance authorization.    Follow Up Recommendations  Skilled nursing-short term rehab (<3 hours/day)    Assistance Recommended at Discharge Frequent or constant Supervision/Assistance  Patient can return home with the following  A little help with walking and/or transfers;A little help with bathing/dressing/bathroom;Assistance with cooking/housework;Direct supervision/assist for financial management;Assist for transportation;Help with stairs or ramp for entrance   Equipment Recommendations  None recommended by OT       Precautions / Restrictions Precautions Precautions: Fall Restrictions RLE Weight Bearing: Weight bearing as tolerated LLE Weight Bearing: Weight bearing as tolerated       Mobility Bed Mobility Overal bed mobility: Needs Assistance Bed Mobility: Supine to Sit     Supine to sit:  Min assist     General bed mobility comments: Pt needs assist for lifting trunk up to sitting.    Transfers Overall transfer level: Needs assistance Equipment used: Rolling walker (2 wheels) Transfers: Sit to/from Stand Sit to Stand: Min assist     Step pivot transfers: Mod assist     General transfer comment: Pt completed 3 sit to stand transitons from the EOB and sat down abruptly on the first two secondary to bladder incontinence and pain in the pelvis with weightbearing.  Finally, she was able to complete stand pivot over to the recliner.     Balance Overall balance assessment: Needs assistance Sitting-balance support: Feet supported Sitting balance-Leahy Scale: Fair     Standing balance support: During functional activity, Reliant on assistive device for balance Standing balance-Leahy Scale: Poor Standing balance comment: Pt needs use of the RW for standing or mobility.                           ADL either performed or assessed with clinical judgement   ADL Overall ADL's : Needs assistance/impaired     Grooming: Wash/dry face;Wash/dry hands;Sitting;Set up   Upper Body Bathing: Supervision/ safety;Sitting   Lower Body Bathing: Minimal assistance;Sit to/from stand   Upper Body Dressing : Minimal assistance;Sitting Upper Body Dressing Details (indicate cue type and reason): hospital gown Lower Body Dressing: Minimal assistance;Sit to/from stand               Functional mobility during ADLs: Moderate assistance;Rolling walker (2 wheels) General ADL Comments: HR elevating from the low 100s up to 120 BPM with activity.  Increased pain noted with standing on the LLE and with mobility.  Decreased in sitting.  Cognition Arousal/Alertness: Awake/alert Behavior During Therapy: WFL for tasks assessed/performed Overall Cognitive Status: Within Functional Limits for tasks assessed Area of Impairment: Awareness                            Awareness: Emergent Problem Solving: Requires verbal cues                     Pertinent Vitals/ Pain       Pain Assessment Pain Assessment: Faces Faces Pain Scale: Hurts little more Pain Location: left hip/pelvis region Pain Descriptors / Indicators: Discomfort Pain Intervention(s): Limited activity within patient's tolerance         Frequency  Min 2X/week        Progress Toward Goals  OT Goals(current goals can now be found in the care plan section)  Progress towards OT goals: Progressing toward goals  Acute Rehab OT Goals Patient Stated Goal: Pt wanted to get OOB. OT Goal Formulation: With patient Time For Goal Achievement: 05/23/22  Plan         AM-PAC OT "6 Clicks" Daily Activity     Outcome Measure   Help from another person eating meals?: None Help from another person taking care of personal grooming?: A Little Help from another person toileting, which includes using toliet, bedpan, or urinal?: A Little Help from another person bathing (including washing, rinsing, drying)?: A Little Help from another person to put on and taking off regular upper body clothing?: A Little Help from another person to put on and taking off regular lower body clothing?: A Little 6 Click Score: 19    End of Session Equipment Utilized During Treatment: Rolling walker (2 wheels)  OT Visit Diagnosis: Other abnormalities of gait and mobility (R26.89);Unsteadiness on feet (R26.81);Muscle weakness (generalized) (M62.81);Pain Pain - Right/Left: Right Pain - part of body: Hip   Activity Tolerance Patient tolerated treatment well   Patient Left in chair;with call bell/phone within reach;with chair alarm set;with nursing/sitter in room   Nurse Communication Mobility status        Time: 3382-5053 OT Time Calculation (min): 49 min  Charges: OT General Charges $OT Visit: 1 Visit OT Treatments $Self Care/Home Management : 38-52 mins  Brettney Ficken  OTR/L 05/15/2022, 1:51 PM

## 2022-05-15 NOTE — Discharge Summary (Signed)
[facility] Discharge Summary   Patient ID: Maria [name] MRN: [id number] DOB/AGE: Feb 12, 1934 88 y.o.  Admit date: 05/08/2022 Discharge date: 05/16/2022   Discharge Diagnosis Fall Left superior/inferior pubic rami fxs  Extraperitoneal left pelvic hematoma causing mass effect on bladder  ABL anemia Left elbow skin tear  A fib on eliquis HTN  Hypothyroidism  Depression   Consultants Orthopedics  Imaging: No results found.  Procedures None  Hospital Course:  Maria [name] is an 88 y.o. female PMH A fib on eliquis, HTN, hypothyroidism who presented to [facility] after suffering a fall at home. She reports falling down 1-3 stairs and landing on her left hip on concrete. She did not hit her head or have LOC. Unable to ambulate so she pushed her life alert and EMS brought her to [facility]. Only complaining of left hip and lower back pain.  Work up showed acute mildly displaced fractures of the left superior and inferior pubic rami with associated extraperitoneal left pelvic hematoma; this hematoma exerts mass effect on the bladder, without definite evidence of bladder injury. Patient was admitted to the trauma service. Foley catheter placed and u/a negative for hematuria ruling out bladder injury. Foley removed and patient was able to void independently. Orthopedics was consulted and recommended nonoperative management of pelvic fractures with WBAT BLE, follow up with Dr. Eulah Pont in 3 weeks.  She did develop an acute blood loss anemia with hemoglobin as low at 7.3. She received 1 unit PRBCs on 05/09/22 with appropriate rise in hemoglobin. She was started on iron and vitamin C. Patient worked with therapies during this admission who recommended CIR upon discharge. Unfortunately her insurance would not cover for this so case management looked into SNF for rehab. On 05/16/22 the patient was felt stable for discharge to SNF.  Patient will follow up as below and knows to call with questions or  concerns.    I have personally reviewed the patients medication history on the [place] controlled substance database.    Physical exam: Gen:  Alert, NAD, pleasant Card: irregular Pulm:  CTAB, no W/R/R, rate and effort normal on room air Abd: Soft, NT/ND Ext:  no BUE/BLE edema, calves soft and nontender, left hip tender with hematoma Psych: A&Ox4  Skin: no rashes noted, warm and dry   Allergies as of 05/16/2022       Reactions   Clindamycin Shortness Of Breath, Rash, Other (See Comments)   Possible shortness of breath- definitely caused a congested nose   Diclofenac Sodium Rash, Other (See Comments)   Nasal congestion, also   Nsaids Rash, Other (See Comments)   Nasal congestion, also        Medication List     TAKE these medications    acetaminophen 325 MG tablet Commonly known as: TYLENOL Take 325-650 mg by mouth every 6 (six) hours as needed for mild pain or headache.   alendronate 70 MG tablet Commonly known as: FOSAMAX Take 70 mg by mouth every Monday. Take with a full glass of water on an empty stomach.   ascorbic acid 500 MG tablet Commonly known as: VITAMIN C Take 500 mg by mouth daily.   bacitracin ointment Apply topically 2 (two) times daily.   diltiazem 300 MG 24 hr capsule Commonly known as: CARDIZEM CD TAKE ONE CAPSULE BY MOUTH EVERY EVENING What changed: when to take this   docusate sodium 100 MG capsule Commonly known as: COLACE Take 1 capsule (100 mg total) by mouth 2 (two) times daily.  Eliquis 5 MG Tabs tablet Generic drug: apixaban Take 5 mg by mouth 2 (two) times daily.   ferrous sulfate 325 (65 FE) MG tablet Take 1 tablet (325 mg total) by mouth 3 (three) times daily with meals.   hydrochlorothiazide 25 MG tablet Commonly known as: HYDRODIURIL Take 25 mg by mouth in the morning.   levothyroxine 88 MCG tablet Commonly known as: SYNTHROID Take 88 mcg by mouth daily before breakfast.   methocarbamol 750 MG tablet Commonly known as:  ROBAXIN Take 1 tablet (750 mg total) by mouth every 6 (six) hours as needed for muscle spasms.   oxyCODONE 5 MG immediate release tablet Commonly known as: Oxy IR/ROXICODONE Take 0.5-1 tablets (2.5-5 mg total) by mouth every 6 (six) hours as needed for severe pain or moderate pain (2.5mg  moderate, 5mg  severe).   Oyster Shell 500 MG Tabs Take 500 mg by mouth daily.   polyethylene glycol 17 g packet Commonly known as: MIRALAX / GLYCOLAX Take 17 g by mouth daily.   potassium chloride SA 20 MEQ tablet Commonly known as: KLOR-CON M Take 20 mEq by mouth daily with breakfast.   senna 8.6 MG Tabs tablet Commonly known as: SENOKOT Take 1 tablet (8.6 mg total) by mouth daily.   sertraline 50 MG tablet Commonly known as: ZOLOFT Take 50 mg by mouth daily.   Vitamin D3 50 MCG (2000 UT) Tabs Take 2,000 Units by mouth daily.          Follow-up Information     , MD. Schedule an appointment as soon as possible for a visit in 3 week(s).   Specialty: Orthopedic Surgery Why: Call to arrange follow up regarding pelvic fractures Contact information: [address] [place] [address]         [phone], MD. Call.   Specialty: Internal Medicine Why: Call to arrange post-hospitalization follow up appointment. Contact information: [address] [place] [phone]         [facility]. Call.   Why: As needed Contact information: [address] [place] [phone]                 Signed: [phone], [facility] 05/15/2022, 2:01 PM Please see Amion for pager number during day hours 7:00am-4:30pm

## 2022-05-15 NOTE — TOC Progression Note (Signed)
Transition of Care [facility]) - Progression Note    Patient Details  Name: JMYA ULIANO MRN: [id number] Date of Birth: 1934-08-24  [facility] CM/SW Contact  Glennon Mac, RN Phone Number: 05/15/2022, 10:30am  Clinical Narrative:    Bed offers given to patient with Medicare ratings. Patient chooses [facility] and [address].  Left message for [facility] in admissions at [facility] to confirm bed offer.   Addendum: 1:14pm Spoke with Nikki at desired SNF: bed offer confirmed.  Facility can accept tomorrow, pending insurance authorization.   Addendum: 1430pm Attempted to initiate insurance authorization through [facility] portal, but it is currently down.  [facility]; per Wynona Canes, portal is completely down at this time.  She advises to fax patient information to [phone] to expedite insurance auth. Clinical information faxed as requested.    Expected Discharge Plan: Skilled Nursing Facility Barriers to Discharge: Continued Medical Work up  Expected Discharge Plan and Services Expected Discharge Plan: Skilled Nursing Facility   Discharge Planning Services: CM Consult                                           Social Determinants of Health (SDOH) Interventions    Readmission Risk Interventions     No data to display         Quintella Baton, RN, BSN  Trauma/Neuro ICU Case Manager [phone]

## 2022-05-16 DIAGNOSIS — I482 Chronic atrial fibrillation, unspecified: Secondary | ICD-10-CM | POA: Diagnosis not present

## 2022-05-16 DIAGNOSIS — W109XXA Fall (on) (from) unspecified stairs and steps, initial encounter: Secondary | ICD-10-CM | POA: Diagnosis not present

## 2022-05-16 DIAGNOSIS — M6281 Muscle weakness (generalized): Secondary | ICD-10-CM | POA: Diagnosis not present

## 2022-05-16 DIAGNOSIS — R1312 Dysphagia, oropharyngeal phase: Secondary | ICD-10-CM | POA: Diagnosis not present

## 2022-05-16 DIAGNOSIS — I10 Essential (primary) hypertension: Secondary | ICD-10-CM | POA: Diagnosis not present

## 2022-05-16 DIAGNOSIS — I4891 Unspecified atrial fibrillation: Secondary | ICD-10-CM | POA: Diagnosis not present

## 2022-05-16 DIAGNOSIS — S32599A Other specified fracture of unspecified pubis, initial encounter for closed fracture: Secondary | ICD-10-CM | POA: Diagnosis not present

## 2022-05-16 DIAGNOSIS — Y92009 Unspecified place in unspecified non-institutional (private) residence as the place of occurrence of the external cause: Secondary | ICD-10-CM | POA: Diagnosis not present

## 2022-05-16 DIAGNOSIS — Z9181 History of falling: Secondary | ICD-10-CM | POA: Diagnosis not present

## 2022-05-16 DIAGNOSIS — S32592A Other specified fracture of left pubis, initial encounter for closed fracture: Secondary | ICD-10-CM | POA: Diagnosis not present

## 2022-05-16 DIAGNOSIS — Z23 Encounter for immunization: Secondary | ICD-10-CM | POA: Diagnosis not present

## 2022-05-16 DIAGNOSIS — E039 Hypothyroidism, unspecified: Secondary | ICD-10-CM | POA: Diagnosis not present

## 2022-05-16 DIAGNOSIS — K5900 Constipation, unspecified: Secondary | ICD-10-CM | POA: Diagnosis not present

## 2022-05-16 DIAGNOSIS — R2689 Other abnormalities of gait and mobility: Secondary | ICD-10-CM | POA: Diagnosis not present

## 2022-05-16 DIAGNOSIS — R2681 Unsteadiness on feet: Secondary | ICD-10-CM | POA: Diagnosis not present

## 2022-05-16 DIAGNOSIS — D62 Acute posthemorrhagic anemia: Secondary | ICD-10-CM | POA: Diagnosis not present

## 2022-05-16 DIAGNOSIS — S32512D Fracture of superior rim of left pubis, subsequent encounter for fracture with routine healing: Secondary | ICD-10-CM | POA: Diagnosis not present

## 2022-05-16 NOTE — Progress Notes (Addendum)
Ivs removed, report called to [place] at [facility].  D/C summary printed for PTAR. Printed Oxy Rx included in packet for PTAR/facility.

## 2022-05-16 NOTE — TOC CM/SW Note (Addendum)
       RE:         Date of Birth:       Date:          To Whom It May Concern:  Please be advised that the above-named patient will require a short-term nursing home stay - anticipated 30 days or less for rehabilitation and strengthening.  The plan is for return home.                 MD signature                Date 

## 2022-05-16 NOTE — TOC Progression Note (Addendum)
Transition of Care [facility]) - Progression Note    Patient Details  Name: CICILIA CLINGER MRN: [id number] Date of Birth: [birth year]  [facility]) CM/SW Contact  Glennon Mac, RN Phone Number: 05/16/2022, 10:00 AM  Clinical Narrative:    Able to access [facility] portal this morning; SNF auth  [id number], submitted on 9/13, remains pending.  Will follow with updates as available.   Addendum: 53am  Insurance auth received from [facility]; ref # [id number], Berkley Harvey number pending.  SNF stay approved from 9/14-9/18 with next review on 9/18.  [facility] Case Manager Sherrlyn Hock, fax clinicals to [phone].    Addendum: 1202 pm Patient agreeable with plan to discharge to [facility] today; she will dc to room 101 at facility.  Bedside nurse to call report to [phone].  PTAR notified for transport at 12:58pm.    Expected Discharge Plan: Skilled Nursing Facility Barriers to Discharge: Continued Medical Work up  Expected Discharge Plan and Services Expected Discharge Plan: Skilled Nursing Facility   Discharge Planning Services: CM Consult                                           Social Determinants of Health (SDOH) Interventions    Readmission Risk Interventions     No data to display         Quintella Baton, RN, BSN  Trauma/Neuro ICU Case Manager [phone]

## 2022-05-16 NOTE — Progress Notes (Signed)
[facility] Progress Note     Subjective: CC-  Sitting in chair and without new complaints.  Tolerating diet.   Objective: Vital signs in last 24 hours: Temp:  [97.6 F (36.4 C)-98.3 F (36.8 C)] 97.9 F (36.6 C) (09/14 0750) Pulse Rate:  [88-117] 96 (09/14 0750) Resp:  [15-20] 17 (09/14 0750) BP: (101-116)/(54-66) 107/54 (09/14 0750) SpO2:  [94 %-100 %] 95 % (09/14 0750) Last BM Date : 05/14/22  Intake/Output from previous day: 09/13 0701 - 09/14 0700 In: 720 [P.O.:720] Out: 600 [Urine:600] Intake/Output this shift: Total I/O In: -  Out: 600 [Urine:600]  PE: Gen:  Alert, NAD, pleasant Card: irregular Pulm:  CTAB, effort normal on room air Abd: Soft, NT/ND Ext:  no BUE/BLE edema, calves soft and nontender, left hip tender with hematoma Psych: A&Ox4  Skin: no rashes noted, warm and dry  Lab Results:  Recent Labs    05/14/22 0156 05/15/22 0202  WBC 11.8* 8.9  HGB 8.5* 8.5*  HCT 26.8* 26.3*  PLT 241 254    BMET Recent Labs    05/15/22 0202  NA 135  K 3.3*  CL 98  CO2 27  GLUCOSE 92  BUN 10  CREATININE 0.68  CALCIUM 8.7*    PT/INR No results for input(s): "LABPROT", "INR" in the last 72 hours. CMP     Component Value Date/Time   NA 135 05/15/2022 0202   K 3.3 (L) 05/15/2022 0202   CL 98 05/15/2022 0202   CO2 27 05/15/2022 0202   GLUCOSE 92 05/15/2022 0202   BUN 10 05/15/2022 0202   CREATININE 0.68 05/15/2022 0202   CALCIUM 8.7 (L) 05/15/2022 0202   PROT 7.6 05/08/2022 1325   ALBUMIN 4.1 05/08/2022 1325   AST 52 (H) 05/08/2022 1325   ALT 25 05/08/2022 1325   ALKPHOS 60 05/08/2022 1325   BILITOT 1.1 05/08/2022 1325   GFRNONAA >60 05/15/2022 0202   Lipase     Component Value Date/Time   LIPASE 39 10/18/2021 1312       Studies/Results: No results found.  Anti-infectives: Anti-infectives (From admission, onward)    None        Assessment/Plan Fall Left superior/inferior pubic rami fxs - nonop, WBAT BLE,  follow up Dr. Eulah Pont in 3 weeks Extraperitoneal left pelvic hematoma causing mass effect on bladder - u/a negative for blood. Voiding without issues ABL anemia - hgb stable at 8.5 last check, continue iron and vitamin C Left elbow skin tear - local wound care A fib on eliquis - eliquis reversed and held on admission HTN - home meds Hypothyroidism - home synthroid Depression - home zoloft ID - none indicated VTE - SCDs, lovenox  FEN - reg diet Foley - none Dispo - 4NP, Lives alone. 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 Insurance auth with [employer] pending. Continue therapies.   LOS: 8 days    Eric Form, [facility] 05/16/2022, 10:22 AM Please see Amion for pager number during day hours 7:00am-4:30pm

## 2022-05-16 NOTE — Care Management Important Message (Signed)
Important Message  Patient Details  Name: YERALDINE FORNEY MRN: [id number] Date of Birth: 1934/04/05   Medicare Important Message Given:  Yes     Sherilyn Banker 05/16/2022, 11:28 AM

## 2022-05-17 DIAGNOSIS — E039 Hypothyroidism, unspecified: Secondary | ICD-10-CM | POA: Diagnosis not present

## 2022-05-17 DIAGNOSIS — I10 Essential (primary) hypertension: Secondary | ICD-10-CM | POA: Diagnosis not present

## 2022-05-17 DIAGNOSIS — I4891 Unspecified atrial fibrillation: Secondary | ICD-10-CM | POA: Diagnosis not present

## 2022-05-17 DIAGNOSIS — S32599A Other specified fracture of unspecified pubis, initial encounter for closed fracture: Secondary | ICD-10-CM | POA: Diagnosis not present

## 2022-05-20 DIAGNOSIS — S32512D Fracture of superior rim of left pubis, subsequent encounter for fracture with routine healing: Secondary | ICD-10-CM | POA: Diagnosis not present

## 2022-05-20 DIAGNOSIS — R2681 Unsteadiness on feet: Secondary | ICD-10-CM | POA: Diagnosis not present

## 2022-05-20 DIAGNOSIS — I10 Essential (primary) hypertension: Secondary | ICD-10-CM | POA: Diagnosis not present

## 2022-05-20 DIAGNOSIS — E039 Hypothyroidism, unspecified: Secondary | ICD-10-CM | POA: Diagnosis not present

## 2022-05-20 DIAGNOSIS — Z9181 History of falling: Secondary | ICD-10-CM | POA: Diagnosis not present

## 2022-05-20 DIAGNOSIS — I4891 Unspecified atrial fibrillation: Secondary | ICD-10-CM | POA: Diagnosis not present

## 2022-05-21 DIAGNOSIS — S32599A Other specified fracture of unspecified pubis, initial encounter for closed fracture: Secondary | ICD-10-CM | POA: Diagnosis not present

## 2022-05-21 DIAGNOSIS — E039 Hypothyroidism, unspecified: Secondary | ICD-10-CM | POA: Diagnosis not present

## 2022-05-21 DIAGNOSIS — I4891 Unspecified atrial fibrillation: Secondary | ICD-10-CM | POA: Diagnosis not present

## 2022-05-21 DIAGNOSIS — I10 Essential (primary) hypertension: Secondary | ICD-10-CM | POA: Diagnosis not present

## 2022-05-22 ENCOUNTER — Other Ambulatory Visit: Payer: Self-pay | Admitting: *Deleted

## 2022-05-22 NOTE — Patient Outreach (Signed)
Ms. Shasteen resides in [facility]. Screening for potential [facility] care coordination/care management services as benefit of insurance plan and PCP.  Facility site visit to [facility]. Met with Marita [place], SNF SW.  Ms. Vankuren is from home alone. Transition plan pending progress.'  Went to speak with Ms. Hlavacek at bedside. However, she was soundly sleeping. Left [facility] literature and writer's contact information at bedside.  Will continue to follow.   Marthenia Rolling, MSN, RN,BSN [place] Acute Care Coordinator [phone] (Direct dial)

## 2022-05-23 DIAGNOSIS — I10 Essential (primary) hypertension: Secondary | ICD-10-CM | POA: Diagnosis not present

## 2022-05-23 DIAGNOSIS — R2681 Unsteadiness on feet: Secondary | ICD-10-CM | POA: Diagnosis not present

## 2022-05-23 DIAGNOSIS — I4891 Unspecified atrial fibrillation: Secondary | ICD-10-CM | POA: Diagnosis not present

## 2022-05-23 DIAGNOSIS — E039 Hypothyroidism, unspecified: Secondary | ICD-10-CM | POA: Diagnosis not present

## 2022-05-23 DIAGNOSIS — S32512D Fracture of superior rim of left pubis, subsequent encounter for fracture with routine healing: Secondary | ICD-10-CM | POA: Diagnosis not present

## 2022-05-23 DIAGNOSIS — Z9181 History of falling: Secondary | ICD-10-CM | POA: Diagnosis not present

## 2022-05-24 DIAGNOSIS — K5900 Constipation, unspecified: Secondary | ICD-10-CM | POA: Diagnosis not present

## 2022-05-24 DIAGNOSIS — S32599A Other specified fracture of unspecified pubis, initial encounter for closed fracture: Secondary | ICD-10-CM | POA: Diagnosis not present

## 2022-05-27 DIAGNOSIS — I10 Essential (primary) hypertension: Secondary | ICD-10-CM | POA: Diagnosis not present

## 2022-05-27 DIAGNOSIS — I482 Chronic atrial fibrillation, unspecified: Secondary | ICD-10-CM | POA: Diagnosis not present

## 2022-05-27 DIAGNOSIS — M6281 Muscle weakness (generalized): Secondary | ICD-10-CM | POA: Diagnosis not present

## 2022-05-27 DIAGNOSIS — Z9181 History of falling: Secondary | ICD-10-CM | POA: Diagnosis not present

## 2022-05-27 DIAGNOSIS — E039 Hypothyroidism, unspecified: Secondary | ICD-10-CM | POA: Diagnosis not present

## 2022-05-27 DIAGNOSIS — S32512D Fracture of superior rim of left pubis, subsequent encounter for fracture with routine healing: Secondary | ICD-10-CM | POA: Diagnosis not present

## 2022-05-27 DIAGNOSIS — R2681 Unsteadiness on feet: Secondary | ICD-10-CM | POA: Diagnosis not present

## 2022-05-27 DIAGNOSIS — I4891 Unspecified atrial fibrillation: Secondary | ICD-10-CM | POA: Diagnosis not present

## 2022-05-30 ENCOUNTER — Other Ambulatory Visit: Payer: Self-pay | Admitting: *Deleted

## 2022-05-30 DIAGNOSIS — I10 Essential (primary) hypertension: Secondary | ICD-10-CM | POA: Diagnosis not present

## 2022-05-30 DIAGNOSIS — E039 Hypothyroidism, unspecified: Secondary | ICD-10-CM | POA: Diagnosis not present

## 2022-05-30 DIAGNOSIS — Z9181 History of falling: Secondary | ICD-10-CM | POA: Diagnosis not present

## 2022-05-30 DIAGNOSIS — S32512D Fracture of superior rim of left pubis, subsequent encounter for fracture with routine healing: Secondary | ICD-10-CM | POA: Diagnosis not present

## 2022-05-30 DIAGNOSIS — I4891 Unspecified atrial fibrillation: Secondary | ICD-10-CM | POA: Diagnosis not present

## 2022-05-30 DIAGNOSIS — R2681 Unsteadiness on feet: Secondary | ICD-10-CM | POA: Diagnosis not present

## 2022-05-30 NOTE — Patient Outreach (Signed)
[facility] Post- Acute Care Coordinator follow up. Ms. Balthazar resides in [facility].   Secure communication sent to [facility] SW to inquire about transition plans.   Will continue to follow.   PCP office [facility] has [facility] care management services.   Marthenia Rolling, MSN, RN,BSN [place] Acute Care Coordinator [phone] (Direct dial)

## 2022-05-31 DIAGNOSIS — S32512D Fracture of superior rim of left pubis, subsequent encounter for fracture with routine healing: Secondary | ICD-10-CM | POA: Diagnosis not present

## 2022-05-31 DIAGNOSIS — I10 Essential (primary) hypertension: Secondary | ICD-10-CM | POA: Diagnosis not present

## 2022-06-03 ENCOUNTER — Other Ambulatory Visit: Payer: Self-pay | Admitting: *Deleted

## 2022-06-03 DIAGNOSIS — I10 Essential (primary) hypertension: Secondary | ICD-10-CM | POA: Diagnosis not present

## 2022-06-03 DIAGNOSIS — E039 Hypothyroidism, unspecified: Secondary | ICD-10-CM | POA: Diagnosis not present

## 2022-06-03 DIAGNOSIS — I482 Chronic atrial fibrillation, unspecified: Secondary | ICD-10-CM | POA: Diagnosis not present

## 2022-06-03 DIAGNOSIS — R2681 Unsteadiness on feet: Secondary | ICD-10-CM | POA: Diagnosis not present

## 2022-06-03 DIAGNOSIS — Z9181 History of falling: Secondary | ICD-10-CM | POA: Diagnosis not present

## 2022-06-03 DIAGNOSIS — I4891 Unspecified atrial fibrillation: Secondary | ICD-10-CM | POA: Diagnosis not present

## 2022-06-03 DIAGNOSIS — S32512D Fracture of superior rim of left pubis, subsequent encounter for fracture with routine healing: Secondary | ICD-10-CM | POA: Diagnosis not present

## 2022-06-03 DIAGNOSIS — M6281 Muscle weakness (generalized): Secondary | ICD-10-CM | POA: Diagnosis not present

## 2022-06-03 NOTE — Patient Outreach (Signed)
[place] Coordinator follow up. Ms. Asbill resides in [facility]. Screening for potential [facility] care coordination services as a benefit of insurance plan and PCP.  Update received from [place], [place] SW indicating transition plans are pending. Will need additional assistance when if she returns home or may require ALF.  Will continue to follow while Ms. Cervi resides in SNF.   Marthenia Rolling, MSN, RN,BSN [place] Acute Care Coordinator [phone] (Direct dial)

## 2022-06-05 DIAGNOSIS — I482 Chronic atrial fibrillation, unspecified: Secondary | ICD-10-CM | POA: Diagnosis not present

## 2022-06-05 DIAGNOSIS — I10 Essential (primary) hypertension: Secondary | ICD-10-CM | POA: Diagnosis not present

## 2022-06-05 DIAGNOSIS — S32512D Fracture of superior rim of left pubis, subsequent encounter for fracture with routine healing: Secondary | ICD-10-CM | POA: Diagnosis not present

## 2022-06-06 DIAGNOSIS — I4891 Unspecified atrial fibrillation: Secondary | ICD-10-CM | POA: Diagnosis not present

## 2022-06-06 DIAGNOSIS — Z9181 History of falling: Secondary | ICD-10-CM | POA: Diagnosis not present

## 2022-06-06 DIAGNOSIS — S32512D Fracture of superior rim of left pubis, subsequent encounter for fracture with routine healing: Secondary | ICD-10-CM | POA: Diagnosis not present

## 2022-06-06 DIAGNOSIS — I10 Essential (primary) hypertension: Secondary | ICD-10-CM | POA: Diagnosis not present

## 2022-06-06 DIAGNOSIS — E039 Hypothyroidism, unspecified: Secondary | ICD-10-CM | POA: Diagnosis not present

## 2022-06-06 DIAGNOSIS — R2681 Unsteadiness on feet: Secondary | ICD-10-CM | POA: Diagnosis not present

## 2022-06-07 ENCOUNTER — Other Ambulatory Visit: Payer: Self-pay | Admitting: *Deleted

## 2022-06-07 DIAGNOSIS — M25552 Pain in left hip: Secondary | ICD-10-CM | POA: Diagnosis not present

## 2022-06-07 DIAGNOSIS — E039 Hypothyroidism, unspecified: Secondary | ICD-10-CM | POA: Diagnosis not present

## 2022-06-07 DIAGNOSIS — F32A Depression, unspecified: Secondary | ICD-10-CM | POA: Diagnosis not present

## 2022-06-07 DIAGNOSIS — Z7983 Long term (current) use of bisphosphonates: Secondary | ICD-10-CM | POA: Diagnosis not present

## 2022-06-07 DIAGNOSIS — S32592D Other specified fracture of left pubis, subsequent encounter for fracture with routine healing: Secondary | ICD-10-CM | POA: Diagnosis not present

## 2022-06-07 DIAGNOSIS — Z9181 History of falling: Secondary | ICD-10-CM | POA: Diagnosis not present

## 2022-06-07 DIAGNOSIS — Z87891 Personal history of nicotine dependence: Secondary | ICD-10-CM | POA: Diagnosis not present

## 2022-06-07 DIAGNOSIS — Z7901 Long term (current) use of anticoagulants: Secondary | ICD-10-CM | POA: Diagnosis not present

## 2022-06-07 DIAGNOSIS — W19XXXD Unspecified fall, subsequent encounter: Secondary | ICD-10-CM | POA: Diagnosis not present

## 2022-06-07 DIAGNOSIS — S32512D Fracture of superior rim of left pubis, subsequent encounter for fracture with routine healing: Secondary | ICD-10-CM | POA: Diagnosis not present

## 2022-06-07 DIAGNOSIS — I10 Essential (primary) hypertension: Secondary | ICD-10-CM | POA: Diagnosis not present

## 2022-06-07 DIAGNOSIS — I482 Chronic atrial fibrillation, unspecified: Secondary | ICD-10-CM | POA: Diagnosis not present

## 2022-06-07 DIAGNOSIS — E785 Hyperlipidemia, unspecified: Secondary | ICD-10-CM | POA: Diagnosis not present

## 2022-06-07 NOTE — Patient Outreach (Signed)
[place] Coordinator follow up.  Met with [facility] [place] SNF SW at [facility] [employer] who indicates Ms. Varnell discharged home on 06/06/22 with [facility] home [facility].   Will send secure notification to [facility] care management. [facility] has [facility] services.   Marthenia Rolling, MSN, RN,BSN [place] Acute Care Coordinator [phone] (Direct dial)

## 2022-06-11 ENCOUNTER — Ambulatory Visit: Payer: Medicare Other | Admitting: Cardiology

## 2022-06-18 DIAGNOSIS — F32A Depression, unspecified: Secondary | ICD-10-CM | POA: Diagnosis not present

## 2022-06-18 DIAGNOSIS — S32512D Fracture of superior rim of left pubis, subsequent encounter for fracture with routine healing: Secondary | ICD-10-CM | POA: Diagnosis not present

## 2022-06-18 DIAGNOSIS — Z87891 Personal history of nicotine dependence: Secondary | ICD-10-CM | POA: Diagnosis not present

## 2022-06-18 DIAGNOSIS — S32592D Other specified fracture of left pubis, subsequent encounter for fracture with routine healing: Secondary | ICD-10-CM | POA: Diagnosis not present

## 2022-06-18 DIAGNOSIS — W19XXXD Unspecified fall, subsequent encounter: Secondary | ICD-10-CM | POA: Diagnosis not present

## 2022-06-18 DIAGNOSIS — Z7983 Long term (current) use of bisphosphonates: Secondary | ICD-10-CM | POA: Diagnosis not present

## 2022-06-18 DIAGNOSIS — E785 Hyperlipidemia, unspecified: Secondary | ICD-10-CM | POA: Diagnosis not present

## 2022-06-18 DIAGNOSIS — I10 Essential (primary) hypertension: Secondary | ICD-10-CM | POA: Diagnosis not present

## 2022-06-18 DIAGNOSIS — Z9181 History of falling: Secondary | ICD-10-CM | POA: Diagnosis not present

## 2022-06-18 DIAGNOSIS — E039 Hypothyroidism, unspecified: Secondary | ICD-10-CM | POA: Diagnosis not present

## 2022-06-18 DIAGNOSIS — Z7901 Long term (current) use of anticoagulants: Secondary | ICD-10-CM | POA: Diagnosis not present

## 2022-06-18 DIAGNOSIS — I482 Chronic atrial fibrillation, unspecified: Secondary | ICD-10-CM | POA: Diagnosis not present

## 2022-06-20 DIAGNOSIS — Z87891 Personal history of nicotine dependence: Secondary | ICD-10-CM | POA: Diagnosis not present

## 2022-06-20 DIAGNOSIS — E039 Hypothyroidism, unspecified: Secondary | ICD-10-CM | POA: Diagnosis not present

## 2022-06-20 DIAGNOSIS — I10 Essential (primary) hypertension: Secondary | ICD-10-CM | POA: Diagnosis not present

## 2022-06-20 DIAGNOSIS — Z7983 Long term (current) use of bisphosphonates: Secondary | ICD-10-CM | POA: Diagnosis not present

## 2022-06-20 DIAGNOSIS — Z7901 Long term (current) use of anticoagulants: Secondary | ICD-10-CM | POA: Diagnosis not present

## 2022-06-20 DIAGNOSIS — F32A Depression, unspecified: Secondary | ICD-10-CM | POA: Diagnosis not present

## 2022-06-20 DIAGNOSIS — W19XXXD Unspecified fall, subsequent encounter: Secondary | ICD-10-CM | POA: Diagnosis not present

## 2022-06-20 DIAGNOSIS — Z9181 History of falling: Secondary | ICD-10-CM | POA: Diagnosis not present

## 2022-06-20 DIAGNOSIS — S32592D Other specified fracture of left pubis, subsequent encounter for fracture with routine healing: Secondary | ICD-10-CM | POA: Diagnosis not present

## 2022-06-20 DIAGNOSIS — S32512D Fracture of superior rim of left pubis, subsequent encounter for fracture with routine healing: Secondary | ICD-10-CM | POA: Diagnosis not present

## 2022-06-20 DIAGNOSIS — E785 Hyperlipidemia, unspecified: Secondary | ICD-10-CM | POA: Diagnosis not present

## 2022-06-20 DIAGNOSIS — I482 Chronic atrial fibrillation, unspecified: Secondary | ICD-10-CM | POA: Diagnosis not present

## 2022-06-24 DIAGNOSIS — W19XXXA Unspecified fall, initial encounter: Secondary | ICD-10-CM | POA: Diagnosis not present

## 2022-06-24 DIAGNOSIS — E039 Hypothyroidism, unspecified: Secondary | ICD-10-CM | POA: Diagnosis not present

## 2022-06-24 DIAGNOSIS — I480 Paroxysmal atrial fibrillation: Secondary | ICD-10-CM | POA: Diagnosis not present

## 2022-06-24 DIAGNOSIS — E785 Hyperlipidemia, unspecified: Secondary | ICD-10-CM | POA: Diagnosis not present

## 2022-06-24 DIAGNOSIS — R54 Age-related physical debility: Secondary | ICD-10-CM | POA: Diagnosis not present

## 2022-06-24 DIAGNOSIS — D649 Anemia, unspecified: Secondary | ICD-10-CM | POA: Diagnosis not present

## 2022-06-24 DIAGNOSIS — I10 Essential (primary) hypertension: Secondary | ICD-10-CM | POA: Diagnosis not present

## 2022-06-24 DIAGNOSIS — S329XXA Fracture of unspecified parts of lumbosacral spine and pelvis, initial encounter for closed fracture: Secondary | ICD-10-CM | POA: Diagnosis not present

## 2022-06-26 DIAGNOSIS — F32A Depression, unspecified: Secondary | ICD-10-CM | POA: Diagnosis not present

## 2022-06-26 DIAGNOSIS — S32512D Fracture of superior rim of left pubis, subsequent encounter for fracture with routine healing: Secondary | ICD-10-CM | POA: Diagnosis not present

## 2022-06-26 DIAGNOSIS — Z7983 Long term (current) use of bisphosphonates: Secondary | ICD-10-CM | POA: Diagnosis not present

## 2022-06-26 DIAGNOSIS — Z87891 Personal history of nicotine dependence: Secondary | ICD-10-CM | POA: Diagnosis not present

## 2022-06-26 DIAGNOSIS — E039 Hypothyroidism, unspecified: Secondary | ICD-10-CM | POA: Diagnosis not present

## 2022-06-26 DIAGNOSIS — W19XXXD Unspecified fall, subsequent encounter: Secondary | ICD-10-CM | POA: Diagnosis not present

## 2022-06-26 DIAGNOSIS — Z9181 History of falling: Secondary | ICD-10-CM | POA: Diagnosis not present

## 2022-06-26 DIAGNOSIS — Z7901 Long term (current) use of anticoagulants: Secondary | ICD-10-CM | POA: Diagnosis not present

## 2022-06-26 DIAGNOSIS — I482 Chronic atrial fibrillation, unspecified: Secondary | ICD-10-CM | POA: Diagnosis not present

## 2022-06-26 DIAGNOSIS — S32592D Other specified fracture of left pubis, subsequent encounter for fracture with routine healing: Secondary | ICD-10-CM | POA: Diagnosis not present

## 2022-06-26 DIAGNOSIS — E785 Hyperlipidemia, unspecified: Secondary | ICD-10-CM | POA: Diagnosis not present

## 2022-06-26 DIAGNOSIS — I10 Essential (primary) hypertension: Secondary | ICD-10-CM | POA: Diagnosis not present

## 2022-06-27 ENCOUNTER — Encounter: Payer: Self-pay | Admitting: Cardiology

## 2022-06-27 ENCOUNTER — Ambulatory Visit: Payer: Medicare Other | Admitting: Cardiology

## 2022-06-27 VITALS — BP 131/86 | HR 108 | Temp 98.1°F | Resp 16 | Ht 64.0 in | Wt 131.8 lb

## 2022-06-27 DIAGNOSIS — I4819 Other persistent atrial fibrillation: Secondary | ICD-10-CM

## 2022-06-27 DIAGNOSIS — E039 Hypothyroidism, unspecified: Secondary | ICD-10-CM | POA: Diagnosis not present

## 2022-06-27 DIAGNOSIS — I10 Essential (primary) hypertension: Secondary | ICD-10-CM | POA: Diagnosis not present

## 2022-06-27 DIAGNOSIS — G8929 Other chronic pain: Secondary | ICD-10-CM | POA: Diagnosis not present

## 2022-06-27 DIAGNOSIS — I480 Paroxysmal atrial fibrillation: Secondary | ICD-10-CM | POA: Diagnosis not present

## 2022-06-27 DIAGNOSIS — K219 Gastro-esophageal reflux disease without esophagitis: Secondary | ICD-10-CM | POA: Diagnosis not present

## 2022-06-27 DIAGNOSIS — E785 Hyperlipidemia, unspecified: Secondary | ICD-10-CM | POA: Diagnosis not present

## 2022-06-27 DIAGNOSIS — Z7901 Long term (current) use of anticoagulants: Secondary | ICD-10-CM | POA: Diagnosis not present

## 2022-06-27 MED ORDER — DILTIAZEM HCL ER COATED BEADS 360 MG PO CP24: 360.0000 mg | ORAL_CAPSULE | Freq: Every day | ORAL | 0 refills | Status: DC

## 2022-06-27 NOTE — Progress Notes (Signed)
ID:  HONOR Maria [name], DOB 1933/11/26, MRN [id number]  PCP:  Maria Cha, MD  Cardiologist:  Maria Kras, DO, [facility] (established care 12/18/2021)  Date: 06/27/22 Last Office Visit: 04/11/2022  Chief Complaint  Patient presents with   Atrial Fibrillation   Follow-up    HPI  Maria [name] is a 88 y.o. 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   She is referred to the office at the request of Maria [name],* for evaluation of atrial fibrillation.  Diagnosed with atrial fibrillation in February 2023 and since then has favored to be treated with rate control strategy.  The dose of Cardizem has been uptitrated in a stepwise fashion and patient is on oral anticoagulation for thromboembolic prophylaxis.  At the last office visit the shared decision was to consider possible cardioversion if she remains in A-fib and has been on [facility] for 4 weeks d  However, in the interim patient had a mechanical fall requiring her to be hospitalized.  She did not undergo orthopedic surgery but recommended nonoperative management of her pelvic fracture with follow-up with Dr. Percell Miller.   She also developed acute blood loss anemia with a hemoglobin is below 7.3 g/dL and had 1 unit of PRBC.  Patient states that the mechanical fall was secondary to missing 2 or 3 stairs.  She denies near-syncope or syncopal events.  However her sister was present at today's visit cannot completely exclude it according to her.  Patient is accompained by his sister Maria [name] who provides collateral history.   Clinically she denies anginal discomfort or heart failure symptoms. She was living independently and now is in assisted living facility.  FUNCTIONAL STATUS: No structured exercise program or daily routine. Lives alone, walks w/ cane, and does her ADLs.     ALLERGIES: Allergies  Allergen Reactions   Clindamycin Shortness Of Breath, Rash and Other (See Comments)    Possible shortness of breath- definitely caused a congested nose   Diclofenac Sodium Rash and Other (See Comments)    Nasal congestion, also   Nsaids Rash and Other (See Comments)    Nasal congestion, also    MEDICATION LIST PRIOR TO VISIT: Current Meds  Medication Sig   diltiazem (CARDIZEM CD) 360 MG 24 hr capsule Take 1 capsule (360 mg total) by mouth daily.   ELIQUIS 5 MG TABS tablet Take 5 mg by mouth 2 (two) times daily.   [DISCONTINUED] diltiazem (CARDIZEM CD) 300 MG 24 hr capsule TAKE ONE CAPSULE BY MOUTH EVERY EVENING (Patient taking differently: Take 300 mg by mouth in the morning.)     PAST MEDICAL HISTORY: Past Medical History:  Diagnosis Date   Atrial fibrillation ([place])    Dyslipidemia    Hypertension    Hypothyroid    Thyroid disease     PAST SURGICAL HISTORY: Past Surgical History:  Procedure Laterality Date   ABDOMINAL HYSTERECTOMY      FAMILY HISTORY: The patient family history includes Heart disease in her brother, father, and mother; Hypertension in her father and mother.  SOCIAL HISTORY:  The patient  reports that she quit smoking about 31 years ago. Her smoking use included cigarettes. She has a 3.00 pack-year smoking history. She has never been exposed to tobacco smoke. She does not have any smokeless tobacco history on file. She reports that she does not drink alcohol and does not use drugs.  REVIEW OF SYSTEMS: Review  of Systems  Cardiovascular:  Positive for irregular heartbeat. Negative for chest pain, claudication, dyspnea on exertion, leg swelling, near-syncope, orthopnea, palpitations, paroxysmal nocturnal dyspnea and syncope.  Respiratory:  Negative for shortness of breath.   Musculoskeletal:  Positive for falls and joint pain.   PHYSICAL EXAM:    06/27/2022   11:40 AM 05/16/2022   11:31 AM 05/16/2022    7:50 AM  Vitals with  BMI  Height 5\' 4"     Weight 131 lbs 13 oz    BMI [id number]    Systolic [id number]  Diastolic 86 68 54  Pulse [id number] 106 96    CONSTITUTIONAL: Age appropriate, walks w/ cane, hemodynamically stable, no acute distress.   SKIN: Skin is warm and dry. No rash noted. No cyanosis. No pallor. No jaundice HEAD: Normocephalic and atraumatic.  EYES: No scleral icterus MOUTH/THROAT: Moist oral membranes.  NECK: No JVD present. No thyromegaly noted. No carotid bruits  CHEST Normal respiratory effort. No intercostal retractions  LUNGS: Clear to auscultation bilaterally.  No stridor. No wheezes. No rales.  CARDIOVASCULAR: Irregularly irregular, variable S1-S2, no murmurs rubs or gallops appreciated secondary to tachycardia.   ABDOMINAL: Soft, nontender, nondistended, positive bowel sounds in all 4 quadrants, no apparent ascites.  EXTREMITIES: No peripheral edema, warm to touch HEMATOLOGIC: No significant bruising NEUROLOGIC: Oriented to person, place, and time. Nonfocal. Normal muscle tone.  PSYCHIATRIC: Normal mood and affect. Normal behavior. Cooperative.   CARDIAC DATABASE: EKG: 01/15/2022: Atrial fibrillation, 111 bpm. 02/26/2022: Atrial fibrillation, 118 bpm.  04/11/2022: Atrial fibrillation, 104 bpm, without underlying injury pattern. 06/27/2022: Atrial fibrillation, 114, without underlying injury pattern.  Echocardiogram: 10/19/2021  1. Left ventricular ejection fraction, by estimation, is 60 to 65%. The left ventricle has normal function. The left ventricle has no regional wall motion abnormalities. Left ventricular diastolic parameters are indeterminate.   2. Right ventricular systolic function is normal. The right ventricular size is normal. Tricuspid regurgitation signal is inadequate for assessing PA pressure.   3. The mitral valve is normal in structure. Trivial mitral valve regurgitation. No evidence of mitral stenosis.   4. The aortic valve is tricuspid. Aortic valve regurgitation is  mild.  Aortic valve sclerosis/calcification is present, without any evidence of aortic stenosis.   5. The patient is in atrial fibrillation.   Stress Testing: No results found for this or any previous visit from the past 1095 days.   Heart Catheterization: None  LABORATORY DATA:    Latest Ref Rng & Units 05/15/2022    2:02 AM 05/14/2022    1:56 AM 05/13/2022    2:14 AM  CBC  WBC 4.0 - 10.5 K/uL 8.9  11.8  9.5   Hemoglobin 12.0 - 15.0 g/dL 8.5  8.5  7.6   Hematocrit 36.0 - 46.0 % 26.3  26.8  23.0   Platelets 150 - 400 K/uL 254  241  189        Latest Ref Rng & Units 05/15/2022    2:02 AM 05/10/2022    7:38 AM 05/09/2022    1:59 AM  CMP  Glucose 70 - 99 mg/dL 92  111  171   BUN 8 - 23 mg/dL 10  11  14    Creatinine 0.44 - 1.00 mg/dL 0.68  0.72  0.88   Sodium 135 - 145 mmol/L 135  137  135   Potassium 3.5 - 5.1 mmol/L 3.3  3.6  3.4   Chloride 98 - 111 mmol/L 98  103  99   CO2  22 - 32 mmol/L 27  25  28    Calcium 8.9 - 10.3 mg/dL 8.7  8.3  8.5     Lipid Panel  No results found for: "CHOL", "TRIG", "HDL", "CHOLHDL", "VLDL", "LDLCALC", "LDLDIRECT", "LABVLDL"  No components found for: "NTPROBNP" No results for input(s): "PROBNP" in the last 8760 hours. Recent Labs    10/18/21 1312  TSH 1.228   BMP Recent Labs    05/09/22 0159 05/10/22 0738 05/15/22 0202  NA 135 137 135  K 3.4* 3.6 3.3*  CL 99 103 98  CO2 28 25 27   GLUCOSE 171* 111* 92  BUN 14 11 10   CREATININE 0.88 0.72 0.68  CALCIUM 8.5* 8.3* 8.7*  GFRNONAA >60 >60 >60    HEMOGLOBIN A1C No results found for: "HGBA1C", "MPG"  IMPRESSION:    ICD-10-CM   1. Persistent atrial fibrillation (HCC)  I48.19 EKG 12-Lead    diltiazem (CARDIZEM CD) 360 MG 24 hr capsule    2. Long term (current) use of anticoagulants  Z79.01     3. Primary hypertension  I10        RECOMMENDATIONS: SHARMILA FRED is a 88 y.o. Caucasian  female whose past medical history and cardiac risk factors include: Hypertension, dyslipidemia,  GERD, paroxysmal atrial fibrillation, hypothyroidism.   Persistent atrial fibrillation/long-term oral anticoagulation: EKG still illustrates atrial fibrillation. Initial plan was to consider cardioversion after being on uninterrupted anticoagulation for 4 weeks. However, in the interim she has had a mechanical fall which she is still recovering from and also required 1 unit of packed red blood cells.  Patient states that she does not want to proceed with cardioversion at this time and would prefer medical therapy. We will increase Cardizem to 360 mg p.o. daily for better rate control. Patient continues to be on Eliquis for thromboembolic prophylaxis. Reemphasized the risks, benefits, and alternatives to anticoagulation given the change. CHA2DS2-VASc SCORE is 4 which correlates to 4 % risk of stroke per year (age, gender, HTN). Questions and concerns addressed to both her and her sisters satisfaction.  Primary hypertension Office blood pressures are well controlled. Medications reconciled but no changes made. Currently managed by primary care provider.  Hypothyroidism, unspecified type Continue Synthroid. Currently managed by primary care provider.  Patient states that he only 2 medications that they have been giving her at the nursing facility is Eliquis and Cardizem.  All of the other medications are currently being held.  We will defer this to her primary care provider or the provider at her nursing facility.  I spoke to both the patient and her sister that they should have a discussion with regards to goals of care, CODE STATUS, and healthcare power of attorney.  They will follow-up with PCP.  FINAL MEDICATION LIST END OF ENCOUNTER: Meds ordered this encounter  Medications   diltiazem (CARDIZEM CD) 360 MG 24 hr capsule    Sig: Take 1 capsule (360 mg total) by mouth daily.    Dispense:  90 capsule    Refill:  0    Medications Discontinued During This Encounter  Medication Reason    diltiazem (CARDIZEM CD) 300 MG 24 hr capsule Dose change     Current Outpatient Medications:    diltiazem (CARDIZEM CD) 360 MG 24 hr capsule, Take 1 capsule (360 mg total) by mouth daily., Disp: 90 capsule, Rfl: 0   ELIQUIS 5 MG TABS tablet, Take 5 mg by mouth 2 (two) times daily., Disp: , Rfl:    acetaminophen (TYLENOL) 325  MG tablet, Take 325-650 mg by mouth every 6 (six) hours as needed for mild pain or headache. (Patient not taking: Reported on 06/27/2022), Disp: , Rfl:    alendronate (FOSAMAX) 70 MG tablet, Take 70 mg by mouth every Monday. Take with a full glass of water on an empty stomach. (Patient not taking: Reported on 06/27/2022), Disp: , Rfl:    bacitracin ointment, Apply topically 2 (two) times daily. (Patient not taking: Reported on 06/27/2022), Disp: 120 g, Rfl: 0   Cholecalciferol (VITAMIN D3) 50 MCG (2000 UT) TABS, Take 2,000 Units by mouth daily. (Patient not taking: Reported on 06/27/2022), Disp: , Rfl:    docusate sodium (COLACE) 100 MG capsule, Take 1 capsule (100 mg total) by mouth 2 (two) times daily. (Patient not taking: Reported on 06/27/2022), Disp: 10 capsule, Rfl: 0   ferrous sulfate 325 (65 FE) MG tablet, Take 1 tablet (325 mg total) by mouth 3 (three) times daily with meals. (Patient not taking: Reported on 06/27/2022), Disp: , Rfl: 3   hydrochlorothiazide (HYDRODIURIL) 25 MG tablet, Take 25 mg by mouth in the morning. (Patient not taking: Reported on 06/27/2022), Disp: , Rfl:    levothyroxine (SYNTHROID) 88 MCG tablet, Take 88 mcg by mouth daily before breakfast. (Patient not taking: Reported on 06/27/2022), Disp: , Rfl:    methocarbamol (ROBAXIN) 750 MG tablet, Take 1 tablet (750 mg total) by mouth every 6 (six) hours as needed for muscle spasms. (Patient not taking: Reported on 06/27/2022), Disp: , Rfl:    oxyCODONE (OXY IR/ROXICODONE) 5 MG immediate release tablet, Take 0.5-1 tablets (2.5-5 mg total) by mouth every 6 (six) hours as needed for severe pain or moderate  pain (2.5mg  moderate, 5mg  severe). (Patient not taking: Reported on 06/27/2022), Disp: 25 tablet, Rfl: 0   Oyster Shell 500 MG TABS, Take 500 mg by mouth daily. (Patient not taking: Reported on 06/27/2022), Disp: , Rfl:    polyethylene glycol (MIRALAX / GLYCOLAX) 17 g packet, Take 17 g by mouth daily. (Patient not taking: Reported on 06/27/2022), Disp: 14 each, Rfl: 0   potassium chloride SA (KLOR-CON M) 20 MEQ tablet, Take 20 mEq by mouth daily with breakfast. (Patient not taking: Reported on 06/27/2022), Disp: , Rfl:    senna (SENOKOT) 8.6 MG TABS tablet, Take 1 tablet (8.6 mg total) by mouth daily. (Patient not taking: Reported on 06/27/2022), Disp: 120 tablet, Rfl: 0   sertraline (ZOLOFT) 50 MG tablet, Take 50 mg by mouth daily. (Patient not taking: Reported on 06/27/2022), Disp: , Rfl:    vitamin C (ASCORBIC ACID) 500 MG tablet, Take 500 mg by mouth daily. (Patient not taking: Reported on 06/27/2022), Disp: , Rfl:   Orders Placed This Encounter  Procedures   EKG 12-Lead    There are no Patient Instructions on file for this visit.   --Continue cardiac medications as reconciled in final medication list. --Return in about 6 months (around 12/27/2022) for Follow up, A. fib. Or sooner if needed. --Continue follow-up with your primary care physician regarding the management of your other chronic comorbid conditions.  Patient's questions and concerns were addressed to her satisfaction. She voices understanding of the instructions provided during this encounter.   This note was created using a voice recognition software as a result there may be grammatical errors inadvertently enclosed that do not reflect the nature of this encounter. Every attempt is made to correct such errors.  Maria [name], [place], [facility]  Pager: [phone] Office: [phone]

## 2022-07-04 ENCOUNTER — Other Ambulatory Visit: Payer: Self-pay

## 2022-07-04 DIAGNOSIS — I4819 Other persistent atrial fibrillation: Secondary | ICD-10-CM

## 2022-07-04 MED ORDER — DILTIAZEM HCL ER COATED BEADS 360 MG PO CP24: 360.0000 mg | ORAL_CAPSULE | Freq: Every day | ORAL | 0 refills | Status: DC

## 2022-07-05 DIAGNOSIS — I10 Essential (primary) hypertension: Secondary | ICD-10-CM | POA: Diagnosis not present

## 2022-07-05 DIAGNOSIS — W19XXXD Unspecified fall, subsequent encounter: Secondary | ICD-10-CM | POA: Diagnosis not present

## 2022-07-05 DIAGNOSIS — Z87891 Personal history of nicotine dependence: Secondary | ICD-10-CM | POA: Diagnosis not present

## 2022-07-05 DIAGNOSIS — S32592D Other specified fracture of left pubis, subsequent encounter for fracture with routine healing: Secondary | ICD-10-CM | POA: Diagnosis not present

## 2022-07-05 DIAGNOSIS — S32512D Fracture of superior rim of left pubis, subsequent encounter for fracture with routine healing: Secondary | ICD-10-CM | POA: Diagnosis not present

## 2022-07-05 DIAGNOSIS — Z7983 Long term (current) use of bisphosphonates: Secondary | ICD-10-CM | POA: Diagnosis not present

## 2022-07-05 DIAGNOSIS — Z9181 History of falling: Secondary | ICD-10-CM | POA: Diagnosis not present

## 2022-07-05 DIAGNOSIS — Z7901 Long term (current) use of anticoagulants: Secondary | ICD-10-CM | POA: Diagnosis not present

## 2022-07-05 DIAGNOSIS — E039 Hypothyroidism, unspecified: Secondary | ICD-10-CM | POA: Diagnosis not present

## 2022-07-05 DIAGNOSIS — I482 Chronic atrial fibrillation, unspecified: Secondary | ICD-10-CM | POA: Diagnosis not present

## 2022-07-05 DIAGNOSIS — E785 Hyperlipidemia, unspecified: Secondary | ICD-10-CM | POA: Diagnosis not present

## 2022-07-05 DIAGNOSIS — F32A Depression, unspecified: Secondary | ICD-10-CM | POA: Diagnosis not present

## 2022-07-24 DIAGNOSIS — K219 Gastro-esophageal reflux disease without esophagitis: Secondary | ICD-10-CM | POA: Diagnosis not present

## 2022-07-24 DIAGNOSIS — I480 Paroxysmal atrial fibrillation: Secondary | ICD-10-CM | POA: Diagnosis not present

## 2022-07-24 DIAGNOSIS — E785 Hyperlipidemia, unspecified: Secondary | ICD-10-CM | POA: Diagnosis not present

## 2022-07-24 DIAGNOSIS — I10 Essential (primary) hypertension: Secondary | ICD-10-CM | POA: Diagnosis not present

## 2022-07-24 DIAGNOSIS — E039 Hypothyroidism, unspecified: Secondary | ICD-10-CM | POA: Diagnosis not present

## 2022-07-24 DIAGNOSIS — G8929 Other chronic pain: Secondary | ICD-10-CM | POA: Diagnosis not present

## 2022-08-23 ENCOUNTER — Other Ambulatory Visit: Payer: Self-pay | Admitting: Cardiology

## 2022-08-23 DIAGNOSIS — I4819 Other persistent atrial fibrillation: Secondary | ICD-10-CM

## 2022-10-15 DIAGNOSIS — M85859 Other specified disorders of bone density and structure, unspecified thigh: Secondary | ICD-10-CM | POA: Diagnosis not present

## 2022-10-15 DIAGNOSIS — R54 Age-related physical debility: Secondary | ICD-10-CM | POA: Diagnosis not present

## 2022-10-15 DIAGNOSIS — E039 Hypothyroidism, unspecified: Secondary | ICD-10-CM | POA: Diagnosis not present

## 2022-10-15 DIAGNOSIS — I480 Paroxysmal atrial fibrillation: Secondary | ICD-10-CM | POA: Diagnosis not present

## 2022-10-15 DIAGNOSIS — Z9989 Dependence on other enabling machines and devices: Secondary | ICD-10-CM | POA: Diagnosis not present

## 2022-10-15 DIAGNOSIS — I10 Essential (primary) hypertension: Secondary | ICD-10-CM | POA: Diagnosis not present

## 2022-10-15 DIAGNOSIS — D6869 Other thrombophilia: Secondary | ICD-10-CM | POA: Diagnosis not present

## 2022-12-26 ENCOUNTER — Ambulatory Visit: Payer: Medicare Other | Admitting: Cardiology

## 2023-04-30 DIAGNOSIS — Z9989 Dependence on other enabling machines and devices: Secondary | ICD-10-CM | POA: Diagnosis not present

## 2023-04-30 DIAGNOSIS — M810 Age-related osteoporosis without current pathological fracture: Secondary | ICD-10-CM | POA: Diagnosis not present

## 2023-04-30 DIAGNOSIS — D6869 Other thrombophilia: Secondary | ICD-10-CM | POA: Diagnosis not present

## 2023-04-30 DIAGNOSIS — I480 Paroxysmal atrial fibrillation: Secondary | ICD-10-CM | POA: Diagnosis not present

## 2023-04-30 DIAGNOSIS — R54 Age-related physical debility: Secondary | ICD-10-CM | POA: Diagnosis not present

## 2023-04-30 DIAGNOSIS — Z0000 Encounter for general adult medical examination without abnormal findings: Secondary | ICD-10-CM | POA: Diagnosis not present

## 2023-04-30 DIAGNOSIS — I10 Essential (primary) hypertension: Secondary | ICD-10-CM | POA: Diagnosis not present

## 2023-04-30 DIAGNOSIS — E039 Hypothyroidism, unspecified: Secondary | ICD-10-CM | POA: Diagnosis not present

## 2023-06-04 ENCOUNTER — Other Ambulatory Visit: Payer: Self-pay | Admitting: Internal Medicine

## 2023-06-04 DIAGNOSIS — M810 Age-related osteoporosis without current pathological fracture: Secondary | ICD-10-CM

## 2023-10-07 ENCOUNTER — Other Ambulatory Visit: Payer: Self-pay

## 2023-10-07 ENCOUNTER — Emergency Department (HOSPITAL_COMMUNITY): Payer: Medicare Other

## 2023-10-07 ENCOUNTER — Emergency Department (HOSPITAL_COMMUNITY)
Admission: EM | Admit: 2023-10-07 | Discharge: 2023-10-08 | Discharge status: Against Medical Advice | Payer: Medicare Other | Attending: Student | Admitting: Student

## 2023-10-07 ENCOUNTER — Encounter (HOSPITAL_COMMUNITY): Payer: Self-pay

## 2023-10-07 DIAGNOSIS — Z5321 Procedure and treatment not carried out due to patient leaving prior to being seen by health care provider: Secondary | ICD-10-CM | POA: Diagnosis not present

## 2023-10-07 DIAGNOSIS — R079 Chest pain, unspecified: Secondary | ICD-10-CM | POA: Diagnosis not present

## 2023-10-07 DIAGNOSIS — R531 Weakness: Secondary | ICD-10-CM | POA: Insufficient documentation

## 2023-10-07 DIAGNOSIS — Z20822 Contact with and (suspected) exposure to covid-19: Secondary | ICD-10-CM | POA: Diagnosis not present

## 2023-10-07 DIAGNOSIS — R0602 Shortness of breath: Secondary | ICD-10-CM | POA: Insufficient documentation

## 2023-10-07 DIAGNOSIS — R6889 Other general symptoms and signs: Secondary | ICD-10-CM | POA: Diagnosis not present

## 2023-10-07 DIAGNOSIS — R0902 Hypoxemia: Secondary | ICD-10-CM | POA: Diagnosis not present

## 2023-10-07 DIAGNOSIS — Z743 Need for continuous supervision: Secondary | ICD-10-CM | POA: Diagnosis not present

## 2023-10-07 LAB — RESP PANEL BY RT-PCR (RSV, FLU A&B, COVID)  RVPGX2
Influenza A by PCR: NEGATIVE
Influenza B by PCR: NEGATIVE
Resp Syncytial Virus by PCR: NEGATIVE
SARS Coronavirus 2 by RT PCR: NEGATIVE

## 2023-10-07 LAB — BASIC METABOLIC PANEL
Anion gap: 11 (ref 5–15)
BUN: 11 mg/dL (ref 8–23)
CO2: 27 mmol/L (ref 22–32)
Calcium: 9.7 mg/dL (ref 8.9–10.3)
Chloride: 96 mmol/L — ABNORMAL LOW (ref 98–111)
Creatinine, Ser: 0.81 mg/dL (ref 0.44–1.00)
GFR, Estimated: >60 mL/min (ref >60)
Glucose, Bld: 106 mg/dL — ABNORMAL HIGH (ref 70–99)
Potassium: 3.7 mmol/L (ref 3.5–5.1)
Sodium: 134 mmol/L — ABNORMAL LOW (ref 135–145)

## 2023-10-07 LAB — CBC
HCT: 37.3 % (ref 36.0–46.0)
Hemoglobin: 12.1 g/dL (ref 12.0–15.0)
MCH: 27.7 pg (ref 26.0–34.0)
MCHC: 32.4 g/dL (ref 30.0–36.0)
MCV: 85.4 fL (ref 80.0–100.0)
Platelets: 237 10*3/uL (ref 150–400)
RBC: 4.37 MIL/uL (ref 3.87–5.11)
RDW: 15.1 % (ref 11.5–15.5)
WBC: 8.7 10*3/uL (ref 4.0–10.5)
nRBC: 0 % (ref 0.0–0.2)

## 2023-10-07 LAB — TROPONIN I (HIGH SENSITIVITY)
Troponin I (High Sensitivity): 5 ng/L (ref <18)
Troponin I (High Sensitivity): 6 ng/L (ref <18)

## 2023-10-07 LAB — BRAIN NATRIURETIC PEPTIDE: B Natriuretic Peptide: 229.7 pg/mL — ABNORMAL HIGH (ref 0.0–100.0)

## 2023-10-07 MED ORDER — ALBUTEROL SULFATE HFA 108 (90 BASE) MCG/ACT IN AERS: 2.0000 | INHALATION_SPRAY | RESPIRATORY_TRACT | Status: DC | PRN

## 2023-10-07 NOTE — ED Provider Triage Note (Signed)
 Emergency Medicine Provider Triage Evaluation Note  Maria [name] , a 89 y.o. female  was evaluated in triage.  Pt complains of weakness, shortness of breath.  Unsure of exactly why she was brought in the emergency department today but she states she has been feeling short of breath.  She states that she feels short of breath frequently.  Arrives without respiratory distress here in the Emergency Department or tachypnea.  Saturating 96% on room air  Review of Systems  Positive: Shortness of breath, fatigue Negative: Chest pain, abdominal pain, nausea, vomiting  Physical Exam  BP 124/66   Pulse 63   Temp 98.1 F (36.7 C) (Oral)   Resp 16   Ht 5' 4 (1.626 m)   Wt 59.4 kg   SpO2 96%   BMI 22.49 kg/m  Gen:   Awake, no distress   Resp:  Normal effort  MSK:   Moves extremities without difficulty    Medical Decision Making  Medically screening exam initiated at 5:01 PM.  Appropriate orders placed.  Maria [name] was informed that the remainder of the evaluation will be completed by another provider, this initial triage assessment does not replace that evaluation, and the importance of remaining in the ED until their evaluation is complete.     Albertina Dixon, MD 10/07/23 [phone]

## 2023-10-07 NOTE — ED Triage Notes (Signed)
Pt reports generalized weakness and SHOB since this am. VSS.

## 2023-10-08 DIAGNOSIS — I10 Essential (primary) hypertension: Secondary | ICD-10-CM | POA: Diagnosis not present

## 2023-10-08 DIAGNOSIS — I480 Paroxysmal atrial fibrillation: Secondary | ICD-10-CM | POA: Diagnosis not present

## 2023-10-08 DIAGNOSIS — N390 Urinary tract infection, site not specified: Secondary | ICD-10-CM | POA: Diagnosis not present

## 2023-10-08 DIAGNOSIS — R410 Disorientation, unspecified: Secondary | ICD-10-CM | POA: Diagnosis not present

## 2023-10-08 DIAGNOSIS — R413 Other amnesia: Secondary | ICD-10-CM | POA: Diagnosis not present

## 2023-10-08 NOTE — ED Notes (Signed)
Pt stated that they wanted to leave and go home. IV taken out. T seen leaving the ED.

## 2023-10-16 DIAGNOSIS — R2689 Other abnormalities of gait and mobility: Secondary | ICD-10-CM | POA: Diagnosis not present

## 2023-10-16 DIAGNOSIS — G319 Degenerative disease of nervous system, unspecified: Secondary | ICD-10-CM | POA: Diagnosis not present

## 2023-10-16 DIAGNOSIS — I69398 Other sequelae of cerebral infarction: Secondary | ICD-10-CM | POA: Diagnosis not present

## 2023-10-16 DIAGNOSIS — I6782 Cerebral ischemia: Secondary | ICD-10-CM | POA: Diagnosis not present

## 2023-10-16 DIAGNOSIS — I69019 Unspecified symptoms and signs involving cognitive functions following nontraumatic subarachnoid hemorrhage: Secondary | ICD-10-CM | POA: Diagnosis not present

## 2023-10-16 DIAGNOSIS — I10 Essential (primary) hypertension: Secondary | ICD-10-CM | POA: Diagnosis not present

## 2023-10-16 DIAGNOSIS — R471 Dysarthria and anarthria: Secondary | ICD-10-CM | POA: Diagnosis not present

## 2023-10-16 DIAGNOSIS — R9082 White matter disease, unspecified: Secondary | ICD-10-CM | POA: Diagnosis not present

## 2023-10-16 DIAGNOSIS — I6523 Occlusion and stenosis of bilateral carotid arteries: Secondary | ICD-10-CM | POA: Diagnosis not present

## 2023-10-16 DIAGNOSIS — I672 Cerebral atherosclerosis: Secondary | ICD-10-CM | POA: Diagnosis not present

## 2023-10-16 DIAGNOSIS — I482 Chronic atrial fibrillation, unspecified: Secondary | ICD-10-CM | POA: Diagnosis not present

## 2023-10-16 DIAGNOSIS — R1310 Dysphagia, unspecified: Secondary | ICD-10-CM | POA: Diagnosis not present

## 2023-10-16 DIAGNOSIS — I4821 Permanent atrial fibrillation: Secondary | ICD-10-CM | POA: Diagnosis not present

## 2023-10-16 DIAGNOSIS — I639 Cerebral infarction, unspecified: Secondary | ICD-10-CM | POA: Diagnosis not present

## 2023-10-16 DIAGNOSIS — I4811 Longstanding persistent atrial fibrillation: Secondary | ICD-10-CM | POA: Diagnosis not present

## 2023-10-16 DIAGNOSIS — Z79899 Other long term (current) drug therapy: Secondary | ICD-10-CM | POA: Diagnosis not present

## 2023-10-16 DIAGNOSIS — R4701 Aphasia: Secondary | ICD-10-CM | POA: Diagnosis not present

## 2023-10-16 DIAGNOSIS — M6281 Muscle weakness (generalized): Secondary | ICD-10-CM | POA: Diagnosis not present

## 2023-10-16 DIAGNOSIS — I6359 Cerebral infarction due to unspecified occlusion or stenosis of other cerebral artery: Secondary | ICD-10-CM | POA: Diagnosis not present

## 2023-10-16 DIAGNOSIS — R278 Other lack of coordination: Secondary | ICD-10-CM | POA: Diagnosis not present

## 2023-10-16 DIAGNOSIS — I088 Other rheumatic multiple valve diseases: Secondary | ICD-10-CM | POA: Diagnosis not present

## 2023-10-16 DIAGNOSIS — E785 Hyperlipidemia, unspecified: Secondary | ICD-10-CM | POA: Diagnosis not present

## 2023-10-16 DIAGNOSIS — R29701 NIHSS score 1: Secondary | ICD-10-CM | POA: Diagnosis not present

## 2023-10-23 DIAGNOSIS — M6281 Muscle weakness (generalized): Secondary | ICD-10-CM | POA: Diagnosis not present

## 2023-10-23 DIAGNOSIS — I10 Essential (primary) hypertension: Secondary | ICD-10-CM | POA: Diagnosis not present

## 2023-10-23 DIAGNOSIS — I4821 Permanent atrial fibrillation: Secondary | ICD-10-CM | POA: Diagnosis not present

## 2023-10-23 DIAGNOSIS — Z8673 Personal history of transient ischemic attack (TIA), and cerebral infarction without residual deficits: Secondary | ICD-10-CM | POA: Diagnosis not present

## 2023-10-23 DIAGNOSIS — R1310 Dysphagia, unspecified: Secondary | ICD-10-CM | POA: Diagnosis not present

## 2023-10-23 DIAGNOSIS — R5381 Other malaise: Secondary | ICD-10-CM | POA: Diagnosis not present

## 2023-10-23 DIAGNOSIS — R2689 Other abnormalities of gait and mobility: Secondary | ICD-10-CM | POA: Diagnosis not present

## 2023-10-23 DIAGNOSIS — E785 Hyperlipidemia, unspecified: Secondary | ICD-10-CM | POA: Diagnosis not present

## 2023-10-23 DIAGNOSIS — R278 Other lack of coordination: Secondary | ICD-10-CM | POA: Diagnosis not present

## 2023-10-23 DIAGNOSIS — I69398 Other sequelae of cerebral infarction: Secondary | ICD-10-CM | POA: Diagnosis not present

## 2023-10-23 DIAGNOSIS — I639 Cerebral infarction, unspecified: Secondary | ICD-10-CM | POA: Diagnosis not present

## 2023-10-23 DIAGNOSIS — I69019 Unspecified symptoms and signs involving cognitive functions following nontraumatic subarachnoid hemorrhage: Secondary | ICD-10-CM | POA: Diagnosis not present

## 2023-10-23 DIAGNOSIS — Z7982 Long term (current) use of aspirin: Secondary | ICD-10-CM | POA: Diagnosis not present

## 2023-10-24 DIAGNOSIS — I4821 Permanent atrial fibrillation: Secondary | ICD-10-CM | POA: Diagnosis not present

## 2023-10-24 DIAGNOSIS — I10 Essential (primary) hypertension: Secondary | ICD-10-CM | POA: Diagnosis not present

## 2023-10-24 DIAGNOSIS — E785 Hyperlipidemia, unspecified: Secondary | ICD-10-CM | POA: Diagnosis not present

## 2023-10-24 DIAGNOSIS — I69398 Other sequelae of cerebral infarction: Secondary | ICD-10-CM | POA: Diagnosis not present

## 2023-10-29 DIAGNOSIS — I4821 Permanent atrial fibrillation: Secondary | ICD-10-CM | POA: Diagnosis not present

## 2023-10-29 DIAGNOSIS — Z8673 Personal history of transient ischemic attack (TIA), and cerebral infarction without residual deficits: Secondary | ICD-10-CM | POA: Diagnosis not present

## 2023-10-29 DIAGNOSIS — I10 Essential (primary) hypertension: Secondary | ICD-10-CM | POA: Diagnosis not present

## 2023-10-29 DIAGNOSIS — Z7982 Long term (current) use of aspirin: Secondary | ICD-10-CM | POA: Diagnosis not present

## 2023-10-29 DIAGNOSIS — R5381 Other malaise: Secondary | ICD-10-CM | POA: Diagnosis not present

## 2023-10-29 DIAGNOSIS — E785 Hyperlipidemia, unspecified: Secondary | ICD-10-CM | POA: Diagnosis not present

## 2023-11-05 DIAGNOSIS — I4821 Permanent atrial fibrillation: Secondary | ICD-10-CM | POA: Diagnosis not present

## 2023-11-05 DIAGNOSIS — Z8673 Personal history of transient ischemic attack (TIA), and cerebral infarction without residual deficits: Secondary | ICD-10-CM | POA: Diagnosis not present

## 2023-11-05 DIAGNOSIS — I10 Essential (primary) hypertension: Secondary | ICD-10-CM | POA: Diagnosis not present

## 2023-11-05 DIAGNOSIS — Z7901 Long term (current) use of anticoagulants: Secondary | ICD-10-CM | POA: Diagnosis not present

## 2023-11-05 DIAGNOSIS — Z7982 Long term (current) use of aspirin: Secondary | ICD-10-CM | POA: Diagnosis not present

## 2023-11-05 DIAGNOSIS — E785 Hyperlipidemia, unspecified: Secondary | ICD-10-CM | POA: Diagnosis not present

## 2023-11-10 DIAGNOSIS — E875 Hyperkalemia: Secondary | ICD-10-CM | POA: Diagnosis not present

## 2023-11-11 DIAGNOSIS — E7800 Pure hypercholesterolemia, unspecified: Secondary | ICD-10-CM | POA: Diagnosis not present

## 2023-11-11 DIAGNOSIS — I4821 Permanent atrial fibrillation: Secondary | ICD-10-CM | POA: Diagnosis not present

## 2023-11-11 DIAGNOSIS — I10 Essential (primary) hypertension: Secondary | ICD-10-CM | POA: Diagnosis not present

## 2023-11-11 DIAGNOSIS — M6281 Muscle weakness (generalized): Secondary | ICD-10-CM | POA: Diagnosis not present

## 2023-11-11 DIAGNOSIS — I6930 Unspecified sequelae of cerebral infarction: Secondary | ICD-10-CM | POA: Diagnosis not present

## 2023-11-14 DIAGNOSIS — E039 Hypothyroidism, unspecified: Secondary | ICD-10-CM | POA: Diagnosis not present

## 2023-11-14 DIAGNOSIS — E559 Vitamin D deficiency, unspecified: Secondary | ICD-10-CM | POA: Diagnosis not present

## 2023-11-14 DIAGNOSIS — D649 Anemia, unspecified: Secondary | ICD-10-CM | POA: Diagnosis not present

## 2023-11-14 DIAGNOSIS — E875 Hyperkalemia: Secondary | ICD-10-CM | POA: Diagnosis not present

## 2023-11-14 DIAGNOSIS — I10 Essential (primary) hypertension: Secondary | ICD-10-CM | POA: Diagnosis not present

## 2023-11-19 DIAGNOSIS — E875 Hyperkalemia: Secondary | ICD-10-CM | POA: Diagnosis not present

## 2023-11-21 DIAGNOSIS — E875 Hyperkalemia: Secondary | ICD-10-CM | POA: Diagnosis not present

## 2023-12-02 DIAGNOSIS — M810 Age-related osteoporosis without current pathological fracture: Secondary | ICD-10-CM | POA: Diagnosis not present

## 2023-12-02 DIAGNOSIS — I4821 Permanent atrial fibrillation: Secondary | ICD-10-CM | POA: Diagnosis not present

## 2023-12-02 DIAGNOSIS — I119 Hypertensive heart disease without heart failure: Secondary | ICD-10-CM | POA: Diagnosis not present

## 2023-12-02 DIAGNOSIS — E559 Vitamin D deficiency, unspecified: Secondary | ICD-10-CM | POA: Diagnosis not present

## 2023-12-02 DIAGNOSIS — G301 Alzheimer's disease with late onset: Secondary | ICD-10-CM | POA: Diagnosis not present

## 2023-12-02 DIAGNOSIS — E782 Mixed hyperlipidemia: Secondary | ICD-10-CM | POA: Diagnosis not present

## 2023-12-04 DIAGNOSIS — M79675 Pain in left toe(s): Secondary | ICD-10-CM | POA: Diagnosis not present

## 2023-12-04 DIAGNOSIS — M79674 Pain in right toe(s): Secondary | ICD-10-CM | POA: Diagnosis not present

## 2023-12-04 DIAGNOSIS — B351 Tinea unguium: Secondary | ICD-10-CM | POA: Diagnosis not present

## 2023-12-04 DIAGNOSIS — L97521 Non-pressure chronic ulcer of other part of left foot limited to breakdown of skin: Secondary | ICD-10-CM | POA: Diagnosis not present

## 2023-12-17 DIAGNOSIS — I4821 Permanent atrial fibrillation: Secondary | ICD-10-CM | POA: Diagnosis not present

## 2023-12-17 DIAGNOSIS — Z8673 Personal history of transient ischemic attack (TIA), and cerebral infarction without residual deficits: Secondary | ICD-10-CM | POA: Diagnosis not present

## 2023-12-17 DIAGNOSIS — E785 Hyperlipidemia, unspecified: Secondary | ICD-10-CM | POA: Diagnosis not present

## 2023-12-17 DIAGNOSIS — I10 Essential (primary) hypertension: Secondary | ICD-10-CM | POA: Diagnosis not present

## 2023-12-30 DIAGNOSIS — I119 Hypertensive heart disease without heart failure: Secondary | ICD-10-CM | POA: Diagnosis not present

## 2023-12-30 DIAGNOSIS — I4821 Permanent atrial fibrillation: Secondary | ICD-10-CM | POA: Diagnosis not present

## 2023-12-30 DIAGNOSIS — E782 Mixed hyperlipidemia: Secondary | ICD-10-CM | POA: Diagnosis not present

## 2023-12-30 DIAGNOSIS — E559 Vitamin D deficiency, unspecified: Secondary | ICD-10-CM | POA: Diagnosis not present

## 2023-12-30 DIAGNOSIS — M810 Age-related osteoporosis without current pathological fracture: Secondary | ICD-10-CM | POA: Diagnosis not present

## 2024-01-06 DIAGNOSIS — I4821 Permanent atrial fibrillation: Secondary | ICD-10-CM | POA: Diagnosis not present

## 2024-01-06 DIAGNOSIS — I119 Hypertensive heart disease without heart failure: Secondary | ICD-10-CM | POA: Diagnosis not present

## 2024-01-06 DIAGNOSIS — E782 Mixed hyperlipidemia: Secondary | ICD-10-CM | POA: Diagnosis not present

## 2024-01-06 DIAGNOSIS — M810 Age-related osteoporosis without current pathological fracture: Secondary | ICD-10-CM | POA: Diagnosis not present

## 2024-01-06 DIAGNOSIS — G301 Alzheimer's disease with late onset: Secondary | ICD-10-CM | POA: Diagnosis not present

## 2024-01-07 DIAGNOSIS — I119 Hypertensive heart disease without heart failure: Secondary | ICD-10-CM | POA: Diagnosis not present

## 2024-01-07 DIAGNOSIS — E559 Vitamin D deficiency, unspecified: Secondary | ICD-10-CM | POA: Diagnosis not present

## 2024-01-07 DIAGNOSIS — E782 Mixed hyperlipidemia: Secondary | ICD-10-CM | POA: Diagnosis not present

## 2024-01-07 DIAGNOSIS — I4821 Permanent atrial fibrillation: Secondary | ICD-10-CM | POA: Diagnosis not present

## 2024-01-07 DIAGNOSIS — M810 Age-related osteoporosis without current pathological fracture: Secondary | ICD-10-CM | POA: Diagnosis not present

## 2024-01-07 DIAGNOSIS — D519 Vitamin B12 deficiency anemia, unspecified: Secondary | ICD-10-CM | POA: Diagnosis not present

## 2024-01-07 DIAGNOSIS — E038 Other specified hypothyroidism: Secondary | ICD-10-CM | POA: Diagnosis not present

## 2024-01-08 DIAGNOSIS — I10 Essential (primary) hypertension: Secondary | ICD-10-CM | POA: Diagnosis not present

## 2024-01-08 DIAGNOSIS — I4821 Permanent atrial fibrillation: Secondary | ICD-10-CM | POA: Diagnosis not present

## 2024-01-15 DIAGNOSIS — I10 Essential (primary) hypertension: Secondary | ICD-10-CM | POA: Diagnosis not present

## 2024-01-30 DIAGNOSIS — R7309 Other abnormal glucose: Secondary | ICD-10-CM | POA: Diagnosis not present

## 2024-01-30 DIAGNOSIS — Z79899 Other long term (current) drug therapy: Secondary | ICD-10-CM | POA: Diagnosis not present

## 2024-01-30 DIAGNOSIS — D519 Vitamin B12 deficiency anemia, unspecified: Secondary | ICD-10-CM | POA: Diagnosis not present

## 2024-01-30 DIAGNOSIS — E038 Other specified hypothyroidism: Secondary | ICD-10-CM | POA: Diagnosis not present

## 2024-01-30 DIAGNOSIS — E782 Mixed hyperlipidemia: Secondary | ICD-10-CM | POA: Diagnosis not present

## 2024-02-06 DIAGNOSIS — I119 Hypertensive heart disease without heart failure: Secondary | ICD-10-CM | POA: Diagnosis not present

## 2024-02-06 DIAGNOSIS — Z79899 Other long term (current) drug therapy: Secondary | ICD-10-CM | POA: Diagnosis not present

## 2024-02-06 DIAGNOSIS — D519 Vitamin B12 deficiency anemia, unspecified: Secondary | ICD-10-CM | POA: Diagnosis not present

## 2024-02-06 DIAGNOSIS — I4821 Permanent atrial fibrillation: Secondary | ICD-10-CM | POA: Diagnosis not present

## 2024-02-06 DIAGNOSIS — E782 Mixed hyperlipidemia: Secondary | ICD-10-CM | POA: Diagnosis not present

## 2024-02-06 DIAGNOSIS — M810 Age-related osteoporosis without current pathological fracture: Secondary | ICD-10-CM | POA: Diagnosis not present

## 2024-02-06 DIAGNOSIS — R7309 Other abnormal glucose: Secondary | ICD-10-CM | POA: Diagnosis not present

## 2024-02-06 DIAGNOSIS — E876 Hypokalemia: Secondary | ICD-10-CM | POA: Diagnosis not present

## 2024-02-11 DIAGNOSIS — I119 Hypertensive heart disease without heart failure: Secondary | ICD-10-CM | POA: Diagnosis not present

## 2024-02-11 DIAGNOSIS — E038 Other specified hypothyroidism: Secondary | ICD-10-CM | POA: Diagnosis not present

## 2024-02-11 DIAGNOSIS — D519 Vitamin B12 deficiency anemia, unspecified: Secondary | ICD-10-CM | POA: Diagnosis not present

## 2024-02-11 DIAGNOSIS — M810 Age-related osteoporosis without current pathological fracture: Secondary | ICD-10-CM | POA: Diagnosis not present

## 2024-02-11 DIAGNOSIS — E559 Vitamin D deficiency, unspecified: Secondary | ICD-10-CM | POA: Diagnosis not present

## 2024-02-11 DIAGNOSIS — E782 Mixed hyperlipidemia: Secondary | ICD-10-CM | POA: Diagnosis not present

## 2024-02-11 DIAGNOSIS — I4821 Permanent atrial fibrillation: Secondary | ICD-10-CM | POA: Diagnosis not present

## 2024-03-02 DIAGNOSIS — G301 Alzheimer's disease with late onset: Secondary | ICD-10-CM | POA: Diagnosis not present

## 2024-03-02 DIAGNOSIS — M810 Age-related osteoporosis without current pathological fracture: Secondary | ICD-10-CM | POA: Diagnosis not present

## 2024-03-02 DIAGNOSIS — I4821 Permanent atrial fibrillation: Secondary | ICD-10-CM | POA: Diagnosis not present

## 2024-03-02 DIAGNOSIS — E876 Hypokalemia: Secondary | ICD-10-CM | POA: Diagnosis not present

## 2024-03-02 DIAGNOSIS — I119 Hypertensive heart disease without heart failure: Secondary | ICD-10-CM | POA: Diagnosis not present

## 2024-03-12 DIAGNOSIS — M810 Age-related osteoporosis without current pathological fracture: Secondary | ICD-10-CM | POA: Diagnosis not present

## 2024-03-12 DIAGNOSIS — E559 Vitamin D deficiency, unspecified: Secondary | ICD-10-CM | POA: Diagnosis not present

## 2024-03-12 DIAGNOSIS — I4821 Permanent atrial fibrillation: Secondary | ICD-10-CM | POA: Diagnosis not present

## 2024-03-12 DIAGNOSIS — I119 Hypertensive heart disease without heart failure: Secondary | ICD-10-CM | POA: Diagnosis not present

## 2024-03-12 DIAGNOSIS — E038 Other specified hypothyroidism: Secondary | ICD-10-CM | POA: Diagnosis not present

## 2024-03-12 DIAGNOSIS — E782 Mixed hyperlipidemia: Secondary | ICD-10-CM | POA: Diagnosis not present

## 2024-03-12 DIAGNOSIS — D519 Vitamin B12 deficiency anemia, unspecified: Secondary | ICD-10-CM | POA: Diagnosis not present

## 2024-03-17 DIAGNOSIS — L608 Other nail disorders: Secondary | ICD-10-CM | POA: Diagnosis not present

## 2024-03-17 DIAGNOSIS — M79674 Pain in right toe(s): Secondary | ICD-10-CM | POA: Diagnosis not present

## 2024-03-17 DIAGNOSIS — B351 Tinea unguium: Secondary | ICD-10-CM | POA: Diagnosis not present

## 2024-03-17 DIAGNOSIS — R2681 Unsteadiness on feet: Secondary | ICD-10-CM | POA: Diagnosis not present

## 2024-03-17 DIAGNOSIS — M79675 Pain in left toe(s): Secondary | ICD-10-CM | POA: Diagnosis not present

## 2024-03-17 DIAGNOSIS — M19071 Primary osteoarthritis, right ankle and foot: Secondary | ICD-10-CM | POA: Diagnosis not present

## 2024-03-30 DIAGNOSIS — M810 Age-related osteoporosis without current pathological fracture: Secondary | ICD-10-CM | POA: Diagnosis not present

## 2024-03-30 DIAGNOSIS — I119 Hypertensive heart disease without heart failure: Secondary | ICD-10-CM | POA: Diagnosis not present

## 2024-03-30 DIAGNOSIS — I4821 Permanent atrial fibrillation: Secondary | ICD-10-CM | POA: Diagnosis not present

## 2024-03-30 DIAGNOSIS — E876 Hypokalemia: Secondary | ICD-10-CM | POA: Diagnosis not present

## 2024-03-30 DIAGNOSIS — E038 Other specified hypothyroidism: Secondary | ICD-10-CM | POA: Diagnosis not present

## 2024-03-30 DIAGNOSIS — G301 Alzheimer's disease with late onset: Secondary | ICD-10-CM | POA: Diagnosis not present

## 2024-04-09 DIAGNOSIS — I4821 Permanent atrial fibrillation: Secondary | ICD-10-CM | POA: Diagnosis not present

## 2024-04-09 DIAGNOSIS — E559 Vitamin D deficiency, unspecified: Secondary | ICD-10-CM | POA: Diagnosis not present

## 2024-04-09 DIAGNOSIS — E782 Mixed hyperlipidemia: Secondary | ICD-10-CM | POA: Diagnosis not present

## 2024-04-09 DIAGNOSIS — I119 Hypertensive heart disease without heart failure: Secondary | ICD-10-CM | POA: Diagnosis not present

## 2024-04-09 DIAGNOSIS — E038 Other specified hypothyroidism: Secondary | ICD-10-CM | POA: Diagnosis not present

## 2024-04-09 DIAGNOSIS — M810 Age-related osteoporosis without current pathological fracture: Secondary | ICD-10-CM | POA: Diagnosis not present

## 2024-04-09 DIAGNOSIS — D519 Vitamin B12 deficiency anemia, unspecified: Secondary | ICD-10-CM | POA: Diagnosis not present

## 2024-04-13 DIAGNOSIS — G301 Alzheimer's disease with late onset: Secondary | ICD-10-CM | POA: Diagnosis not present

## 2024-04-13 DIAGNOSIS — T7840XA Allergy, unspecified, initial encounter: Secondary | ICD-10-CM | POA: Diagnosis not present

## 2024-04-13 DIAGNOSIS — L03114 Cellulitis of left upper limb: Secondary | ICD-10-CM | POA: Diagnosis not present

## 2024-04-15 DIAGNOSIS — L03114 Cellulitis of left upper limb: Secondary | ICD-10-CM | POA: Diagnosis not present

## 2024-04-15 DIAGNOSIS — T7840XD Allergy, unspecified, subsequent encounter: Secondary | ICD-10-CM | POA: Diagnosis not present

## 2024-04-15 DIAGNOSIS — G301 Alzheimer's disease with late onset: Secondary | ICD-10-CM | POA: Diagnosis not present

## 2024-04-15 DIAGNOSIS — L299 Pruritus, unspecified: Secondary | ICD-10-CM | POA: Diagnosis not present

## 2024-04-18 DIAGNOSIS — S31801A Laceration without foreign body of unspecified buttock, initial encounter: Secondary | ICD-10-CM | POA: Diagnosis not present

## 2024-04-20 DIAGNOSIS — S31819D Unspecified open wound of right buttock, subsequent encounter: Secondary | ICD-10-CM | POA: Diagnosis not present

## 2024-04-20 DIAGNOSIS — I119 Hypertensive heart disease without heart failure: Secondary | ICD-10-CM | POA: Diagnosis not present

## 2024-04-27 DIAGNOSIS — Z7982 Long term (current) use of aspirin: Secondary | ICD-10-CM | POA: Diagnosis not present

## 2024-04-27 DIAGNOSIS — E782 Mixed hyperlipidemia: Secondary | ICD-10-CM | POA: Diagnosis not present

## 2024-04-27 DIAGNOSIS — E038 Other specified hypothyroidism: Secondary | ICD-10-CM | POA: Diagnosis not present

## 2024-04-27 DIAGNOSIS — L89311 Pressure ulcer of right buttock, stage 1: Secondary | ICD-10-CM | POA: Diagnosis not present

## 2024-04-27 DIAGNOSIS — Z7901 Long term (current) use of anticoagulants: Secondary | ICD-10-CM | POA: Diagnosis not present

## 2024-04-27 DIAGNOSIS — I119 Hypertensive heart disease without heart failure: Secondary | ICD-10-CM | POA: Diagnosis not present

## 2024-04-27 DIAGNOSIS — Z7983 Long term (current) use of bisphosphonates: Secondary | ICD-10-CM | POA: Diagnosis not present

## 2024-04-27 DIAGNOSIS — S31819D Unspecified open wound of right buttock, subsequent encounter: Secondary | ICD-10-CM | POA: Diagnosis not present

## 2024-04-27 DIAGNOSIS — E7800 Pure hypercholesterolemia, unspecified: Secondary | ICD-10-CM | POA: Diagnosis not present

## 2024-04-27 DIAGNOSIS — Z556 Problems related to health literacy: Secondary | ICD-10-CM | POA: Diagnosis not present

## 2024-04-27 DIAGNOSIS — M810 Age-related osteoporosis without current pathological fracture: Secondary | ICD-10-CM | POA: Diagnosis not present

## 2024-04-27 DIAGNOSIS — E039 Hypothyroidism, unspecified: Secondary | ICD-10-CM | POA: Diagnosis not present

## 2024-04-27 DIAGNOSIS — M6281 Muscle weakness (generalized): Secondary | ICD-10-CM | POA: Diagnosis not present

## 2024-04-27 DIAGNOSIS — I482 Chronic atrial fibrillation, unspecified: Secondary | ICD-10-CM | POA: Diagnosis not present

## 2024-04-27 DIAGNOSIS — I10 Essential (primary) hypertension: Secondary | ICD-10-CM | POA: Diagnosis not present

## 2024-04-27 DIAGNOSIS — Z8673 Personal history of transient ischemic attack (TIA), and cerebral infarction without residual deficits: Secondary | ICD-10-CM | POA: Diagnosis not present

## 2024-04-27 DIAGNOSIS — I4821 Permanent atrial fibrillation: Secondary | ICD-10-CM | POA: Diagnosis not present

## 2024-04-30 DIAGNOSIS — E782 Mixed hyperlipidemia: Secondary | ICD-10-CM | POA: Diagnosis not present

## 2024-04-30 DIAGNOSIS — E038 Other specified hypothyroidism: Secondary | ICD-10-CM | POA: Diagnosis not present

## 2024-04-30 DIAGNOSIS — Z79899 Other long term (current) drug therapy: Secondary | ICD-10-CM | POA: Diagnosis not present

## 2024-04-30 DIAGNOSIS — E559 Vitamin D deficiency, unspecified: Secondary | ICD-10-CM | POA: Diagnosis not present

## 2024-04-30 DIAGNOSIS — R7309 Other abnormal glucose: Secondary | ICD-10-CM | POA: Diagnosis not present

## 2024-04-30 DIAGNOSIS — D519 Vitamin B12 deficiency anemia, unspecified: Secondary | ICD-10-CM | POA: Diagnosis not present

## 2024-05-05 DIAGNOSIS — I10 Essential (primary) hypertension: Secondary | ICD-10-CM | POA: Diagnosis not present

## 2024-05-05 DIAGNOSIS — Z7983 Long term (current) use of bisphosphonates: Secondary | ICD-10-CM | POA: Diagnosis not present

## 2024-05-05 DIAGNOSIS — E039 Hypothyroidism, unspecified: Secondary | ICD-10-CM | POA: Diagnosis not present

## 2024-05-05 DIAGNOSIS — M810 Age-related osteoporosis without current pathological fracture: Secondary | ICD-10-CM | POA: Diagnosis not present

## 2024-05-05 DIAGNOSIS — Z556 Problems related to health literacy: Secondary | ICD-10-CM | POA: Diagnosis not present

## 2024-05-05 DIAGNOSIS — Z8673 Personal history of transient ischemic attack (TIA), and cerebral infarction without residual deficits: Secondary | ICD-10-CM | POA: Diagnosis not present

## 2024-05-05 DIAGNOSIS — L89311 Pressure ulcer of right buttock, stage 1: Secondary | ICD-10-CM | POA: Diagnosis not present

## 2024-05-05 DIAGNOSIS — Z7901 Long term (current) use of anticoagulants: Secondary | ICD-10-CM | POA: Diagnosis not present

## 2024-05-05 DIAGNOSIS — E7800 Pure hypercholesterolemia, unspecified: Secondary | ICD-10-CM | POA: Diagnosis not present

## 2024-05-05 DIAGNOSIS — M6281 Muscle weakness (generalized): Secondary | ICD-10-CM | POA: Diagnosis not present

## 2024-05-05 DIAGNOSIS — I4821 Permanent atrial fibrillation: Secondary | ICD-10-CM | POA: Diagnosis not present

## 2024-05-05 DIAGNOSIS — Z7982 Long term (current) use of aspirin: Secondary | ICD-10-CM | POA: Diagnosis not present

## 2024-05-10 DIAGNOSIS — M810 Age-related osteoporosis without current pathological fracture: Secondary | ICD-10-CM | POA: Diagnosis not present

## 2024-05-10 DIAGNOSIS — I119 Hypertensive heart disease without heart failure: Secondary | ICD-10-CM | POA: Diagnosis not present

## 2024-05-10 DIAGNOSIS — I4821 Permanent atrial fibrillation: Secondary | ICD-10-CM | POA: Diagnosis not present

## 2024-05-10 DIAGNOSIS — E559 Vitamin D deficiency, unspecified: Secondary | ICD-10-CM | POA: Diagnosis not present

## 2024-05-10 DIAGNOSIS — E782 Mixed hyperlipidemia: Secondary | ICD-10-CM | POA: Diagnosis not present

## 2024-05-10 DIAGNOSIS — E038 Other specified hypothyroidism: Secondary | ICD-10-CM | POA: Diagnosis not present

## 2024-05-10 DIAGNOSIS — D519 Vitamin B12 deficiency anemia, unspecified: Secondary | ICD-10-CM | POA: Diagnosis not present

## 2024-05-12 DIAGNOSIS — Z556 Problems related to health literacy: Secondary | ICD-10-CM | POA: Diagnosis not present

## 2024-05-12 DIAGNOSIS — L89311 Pressure ulcer of right buttock, stage 1: Secondary | ICD-10-CM | POA: Diagnosis not present

## 2024-05-12 DIAGNOSIS — I10 Essential (primary) hypertension: Secondary | ICD-10-CM | POA: Diagnosis not present

## 2024-05-12 DIAGNOSIS — M6281 Muscle weakness (generalized): Secondary | ICD-10-CM | POA: Diagnosis not present

## 2024-05-12 DIAGNOSIS — M810 Age-related osteoporosis without current pathological fracture: Secondary | ICD-10-CM | POA: Diagnosis not present

## 2024-05-12 DIAGNOSIS — E7800 Pure hypercholesterolemia, unspecified: Secondary | ICD-10-CM | POA: Diagnosis not present

## 2024-05-12 DIAGNOSIS — Z7982 Long term (current) use of aspirin: Secondary | ICD-10-CM | POA: Diagnosis not present

## 2024-05-12 DIAGNOSIS — I4821 Permanent atrial fibrillation: Secondary | ICD-10-CM | POA: Diagnosis not present

## 2024-05-12 DIAGNOSIS — E039 Hypothyroidism, unspecified: Secondary | ICD-10-CM | POA: Diagnosis not present

## 2024-05-12 DIAGNOSIS — Z7983 Long term (current) use of bisphosphonates: Secondary | ICD-10-CM | POA: Diagnosis not present

## 2024-05-12 DIAGNOSIS — Z7901 Long term (current) use of anticoagulants: Secondary | ICD-10-CM | POA: Diagnosis not present

## 2024-05-12 DIAGNOSIS — Z8673 Personal history of transient ischemic attack (TIA), and cerebral infarction without residual deficits: Secondary | ICD-10-CM | POA: Diagnosis not present

## 2024-05-21 DIAGNOSIS — M810 Age-related osteoporosis without current pathological fracture: Secondary | ICD-10-CM | POA: Diagnosis not present

## 2024-05-21 DIAGNOSIS — E7800 Pure hypercholesterolemia, unspecified: Secondary | ICD-10-CM | POA: Diagnosis not present

## 2024-05-21 DIAGNOSIS — Z8673 Personal history of transient ischemic attack (TIA), and cerebral infarction without residual deficits: Secondary | ICD-10-CM | POA: Diagnosis not present

## 2024-05-21 DIAGNOSIS — L89311 Pressure ulcer of right buttock, stage 1: Secondary | ICD-10-CM | POA: Diagnosis not present

## 2024-05-21 DIAGNOSIS — Z556 Problems related to health literacy: Secondary | ICD-10-CM | POA: Diagnosis not present

## 2024-05-21 DIAGNOSIS — Z7983 Long term (current) use of bisphosphonates: Secondary | ICD-10-CM | POA: Diagnosis not present

## 2024-05-21 DIAGNOSIS — Z7901 Long term (current) use of anticoagulants: Secondary | ICD-10-CM | POA: Diagnosis not present

## 2024-05-21 DIAGNOSIS — M6281 Muscle weakness (generalized): Secondary | ICD-10-CM | POA: Diagnosis not present

## 2024-05-21 DIAGNOSIS — Z7982 Long term (current) use of aspirin: Secondary | ICD-10-CM | POA: Diagnosis not present

## 2024-05-21 DIAGNOSIS — E039 Hypothyroidism, unspecified: Secondary | ICD-10-CM | POA: Diagnosis not present

## 2024-05-21 DIAGNOSIS — I4821 Permanent atrial fibrillation: Secondary | ICD-10-CM | POA: Diagnosis not present

## 2024-05-21 DIAGNOSIS — I10 Essential (primary) hypertension: Secondary | ICD-10-CM | POA: Diagnosis not present

## 2024-05-25 DIAGNOSIS — M810 Age-related osteoporosis without current pathological fracture: Secondary | ICD-10-CM | POA: Diagnosis not present

## 2024-05-25 DIAGNOSIS — I119 Hypertensive heart disease without heart failure: Secondary | ICD-10-CM | POA: Diagnosis not present

## 2024-05-25 DIAGNOSIS — E782 Mixed hyperlipidemia: Secondary | ICD-10-CM | POA: Diagnosis not present

## 2024-05-25 DIAGNOSIS — I4821 Permanent atrial fibrillation: Secondary | ICD-10-CM | POA: Diagnosis not present

## 2024-05-25 DIAGNOSIS — E038 Other specified hypothyroidism: Secondary | ICD-10-CM | POA: Diagnosis not present

## 2024-05-27 DIAGNOSIS — B351 Tinea unguium: Secondary | ICD-10-CM | POA: Diagnosis not present

## 2024-05-27 DIAGNOSIS — L84 Corns and callosities: Secondary | ICD-10-CM | POA: Diagnosis not present

## 2024-05-27 DIAGNOSIS — M79674 Pain in right toe(s): Secondary | ICD-10-CM | POA: Diagnosis not present

## 2024-05-27 DIAGNOSIS — L603 Nail dystrophy: Secondary | ICD-10-CM | POA: Diagnosis not present

## 2024-05-27 DIAGNOSIS — I4891 Unspecified atrial fibrillation: Secondary | ICD-10-CM | POA: Diagnosis not present

## 2024-05-27 DIAGNOSIS — M79675 Pain in left toe(s): Secondary | ICD-10-CM | POA: Diagnosis not present

## 2024-05-27 DIAGNOSIS — R2681 Unsteadiness on feet: Secondary | ICD-10-CM | POA: Diagnosis not present

## 2024-06-07 DIAGNOSIS — I10 Essential (primary) hypertension: Secondary | ICD-10-CM | POA: Diagnosis not present

## 2024-06-07 DIAGNOSIS — L89311 Pressure ulcer of right buttock, stage 1: Secondary | ICD-10-CM | POA: Diagnosis not present

## 2024-06-07 DIAGNOSIS — M810 Age-related osteoporosis without current pathological fracture: Secondary | ICD-10-CM | POA: Diagnosis not present

## 2024-06-07 DIAGNOSIS — M6281 Muscle weakness (generalized): Secondary | ICD-10-CM | POA: Diagnosis not present

## 2024-06-07 DIAGNOSIS — E7800 Pure hypercholesterolemia, unspecified: Secondary | ICD-10-CM | POA: Diagnosis not present

## 2024-06-07 DIAGNOSIS — I4821 Permanent atrial fibrillation: Secondary | ICD-10-CM | POA: Diagnosis not present

## 2024-06-13 DIAGNOSIS — L309 Dermatitis, unspecified: Secondary | ICD-10-CM | POA: Diagnosis not present

## 2024-06-13 DIAGNOSIS — R21 Rash and other nonspecific skin eruption: Secondary | ICD-10-CM | POA: Diagnosis not present

## 2024-06-22 DIAGNOSIS — L309 Dermatitis, unspecified: Secondary | ICD-10-CM | POA: Diagnosis not present

## 2024-06-22 DIAGNOSIS — E782 Mixed hyperlipidemia: Secondary | ICD-10-CM | POA: Diagnosis not present

## 2024-06-22 DIAGNOSIS — M810 Age-related osteoporosis without current pathological fracture: Secondary | ICD-10-CM | POA: Diagnosis not present

## 2024-06-22 DIAGNOSIS — I119 Hypertensive heart disease without heart failure: Secondary | ICD-10-CM | POA: Diagnosis not present

## 2024-06-22 DIAGNOSIS — E038 Other specified hypothyroidism: Secondary | ICD-10-CM | POA: Diagnosis not present

## 2024-06-22 DIAGNOSIS — I4821 Permanent atrial fibrillation: Secondary | ICD-10-CM | POA: Diagnosis not present

## 2024-06-30 DIAGNOSIS — L89311 Pressure ulcer of right buttock, stage 1: Secondary | ICD-10-CM | POA: Diagnosis not present

## 2024-06-30 DIAGNOSIS — I4821 Permanent atrial fibrillation: Secondary | ICD-10-CM | POA: Diagnosis not present

## 2024-06-30 DIAGNOSIS — E7800 Pure hypercholesterolemia, unspecified: Secondary | ICD-10-CM | POA: Diagnosis not present

## 2024-06-30 DIAGNOSIS — I10 Essential (primary) hypertension: Secondary | ICD-10-CM | POA: Diagnosis not present

## 2024-06-30 DIAGNOSIS — M810 Age-related osteoporosis without current pathological fracture: Secondary | ICD-10-CM | POA: Diagnosis not present

## 2024-06-30 DIAGNOSIS — M6281 Muscle weakness (generalized): Secondary | ICD-10-CM | POA: Diagnosis not present
